# Patient Record
Sex: Female | Born: 1940 | Race: White | Hispanic: No | Smoking: Never smoker
Health system: Southern US, Community
[De-identification: ages and names within clinical notes are randomized; demographics above are authoritative.]

## PROBLEM LIST (undated history)

## (undated) DIAGNOSIS — L719 Rosacea, unspecified: Secondary | ICD-10-CM

## (undated) DIAGNOSIS — K279 Peptic ulcer, site unspecified, unspecified as acute or chronic, without hemorrhage or perforation: Secondary | ICD-10-CM

## (undated) DIAGNOSIS — N390 Urinary tract infection, site not specified: Secondary | ICD-10-CM

## (undated) DIAGNOSIS — E785 Hyperlipidemia, unspecified: Secondary | ICD-10-CM

## (undated) DIAGNOSIS — T7840XA Allergy, unspecified, initial encounter: Secondary | ICD-10-CM

## (undated) DIAGNOSIS — M1990 Unspecified osteoarthritis, unspecified site: Secondary | ICD-10-CM

## (undated) DIAGNOSIS — K219 Gastro-esophageal reflux disease without esophagitis: Secondary | ICD-10-CM

## (undated) DIAGNOSIS — M5430 Sciatica, unspecified side: Secondary | ICD-10-CM

## (undated) DIAGNOSIS — H269 Unspecified cataract: Secondary | ICD-10-CM

## (undated) DIAGNOSIS — J45909 Unspecified asthma, uncomplicated: Secondary | ICD-10-CM

## (undated) DIAGNOSIS — C801 Malignant (primary) neoplasm, unspecified: Secondary | ICD-10-CM

## (undated) HISTORY — DX: Unspecified osteoarthritis, unspecified site: M19.90

## (undated) HISTORY — PX: COLONOSCOPY: SHX174

## (undated) HISTORY — DX: Unspecified asthma, uncomplicated: J45.909

## (undated) HISTORY — DX: Urinary tract infection, site not specified: N39.0

## (undated) HISTORY — DX: Sciatica, unspecified side: M54.30

## (undated) HISTORY — DX: Unspecified cataract: H26.9

## (undated) HISTORY — DX: Hyperlipidemia, unspecified: E78.5

## (undated) HISTORY — DX: Peptic ulcer, site unspecified, unspecified as acute or chronic, without hemorrhage or perforation: K27.9

## (undated) HISTORY — PX: CATARACT EXTRACTION: SUR2

## (undated) HISTORY — DX: Allergy, unspecified, initial encounter: T78.40XA

## (undated) HISTORY — DX: Gastro-esophageal reflux disease without esophagitis: K21.9

## (undated) HISTORY — PX: NASAL SEPTUM SURGERY: SHX37

## (undated) HISTORY — DX: Malignant (primary) neoplasm, unspecified: C80.1

---

## 1958-07-14 HISTORY — PX: PILONIDAL CYST EXCISION: SHX744

## 1958-07-14 HISTORY — PX: TONSILLECTOMY: SUR1361

## 1992-07-14 HISTORY — PX: TOTAL HIP ARTHROPLASTY: SHX124

## 1998-07-14 HISTORY — PX: LASIK: SHX215

## 2004-11-19 ENCOUNTER — Emergency Department (HOSPITAL_COMMUNITY): Admission: EM | Admit: 2004-11-19 | Discharge: 2004-11-19 | Payer: Self-pay | Admitting: Emergency Medicine

## 2006-07-14 HISTORY — PX: COLON RESECTION: SHX5231

## 2011-07-17 DIAGNOSIS — H2510 Age-related nuclear cataract, unspecified eye: Secondary | ICD-10-CM | POA: Diagnosis not present

## 2011-07-21 DIAGNOSIS — M545 Low back pain: Secondary | ICD-10-CM | POA: Diagnosis not present

## 2011-07-21 DIAGNOSIS — M5126 Other intervertebral disc displacement, lumbar region: Secondary | ICD-10-CM | POA: Diagnosis not present

## 2011-07-21 DIAGNOSIS — M48061 Spinal stenosis, lumbar region without neurogenic claudication: Secondary | ICD-10-CM | POA: Diagnosis not present

## 2011-07-28 DIAGNOSIS — H524 Presbyopia: Secondary | ICD-10-CM | POA: Diagnosis not present

## 2011-07-28 DIAGNOSIS — H2510 Age-related nuclear cataract, unspecified eye: Secondary | ICD-10-CM | POA: Diagnosis not present

## 2011-07-28 DIAGNOSIS — Z8719 Personal history of other diseases of the digestive system: Secondary | ICD-10-CM | POA: Diagnosis not present

## 2011-07-28 DIAGNOSIS — Z85038 Personal history of other malignant neoplasm of large intestine: Secondary | ICD-10-CM | POA: Diagnosis not present

## 2011-07-28 DIAGNOSIS — H269 Unspecified cataract: Secondary | ICD-10-CM | POA: Diagnosis not present

## 2011-07-28 DIAGNOSIS — J45909 Unspecified asthma, uncomplicated: Secondary | ICD-10-CM | POA: Diagnosis not present

## 2011-08-07 DIAGNOSIS — M5430 Sciatica, unspecified side: Secondary | ICD-10-CM | POA: Diagnosis not present

## 2011-09-02 DIAGNOSIS — J45909 Unspecified asthma, uncomplicated: Secondary | ICD-10-CM | POA: Diagnosis not present

## 2011-10-16 DIAGNOSIS — L02619 Cutaneous abscess of unspecified foot: Secondary | ICD-10-CM | POA: Diagnosis not present

## 2011-10-16 DIAGNOSIS — L03119 Cellulitis of unspecified part of limb: Secondary | ICD-10-CM | POA: Diagnosis not present

## 2011-10-16 DIAGNOSIS — L989 Disorder of the skin and subcutaneous tissue, unspecified: Secondary | ICD-10-CM | POA: Diagnosis not present

## 2011-11-26 DIAGNOSIS — M5030 Other cervical disc degeneration, unspecified cervical region: Secondary | ICD-10-CM | POA: Diagnosis not present

## 2011-11-26 DIAGNOSIS — M533 Sacrococcygeal disorders, not elsewhere classified: Secondary | ICD-10-CM | POA: Diagnosis not present

## 2011-11-26 DIAGNOSIS — M999 Biomechanical lesion, unspecified: Secondary | ICD-10-CM | POA: Diagnosis not present

## 2011-11-26 DIAGNOSIS — Z96649 Presence of unspecified artificial hip joint: Secondary | ICD-10-CM | POA: Diagnosis not present

## 2011-11-27 DIAGNOSIS — R609 Edema, unspecified: Secondary | ICD-10-CM | POA: Diagnosis not present

## 2011-12-18 DIAGNOSIS — Z1231 Encounter for screening mammogram for malignant neoplasm of breast: Secondary | ICD-10-CM | POA: Diagnosis not present

## 2012-01-01 DIAGNOSIS — L299 Pruritus, unspecified: Secondary | ICD-10-CM | POA: Diagnosis not present

## 2012-01-01 DIAGNOSIS — E785 Hyperlipidemia, unspecified: Secondary | ICD-10-CM | POA: Diagnosis not present

## 2012-01-01 DIAGNOSIS — J45909 Unspecified asthma, uncomplicated: Secondary | ICD-10-CM | POA: Diagnosis not present

## 2012-01-01 DIAGNOSIS — K219 Gastro-esophageal reflux disease without esophagitis: Secondary | ICD-10-CM | POA: Diagnosis not present

## 2012-01-08 DIAGNOSIS — M5030 Other cervical disc degeneration, unspecified cervical region: Secondary | ICD-10-CM | POA: Diagnosis not present

## 2012-01-08 DIAGNOSIS — M533 Sacrococcygeal disorders, not elsewhere classified: Secondary | ICD-10-CM | POA: Diagnosis not present

## 2012-01-08 DIAGNOSIS — M999 Biomechanical lesion, unspecified: Secondary | ICD-10-CM | POA: Diagnosis not present

## 2012-01-08 DIAGNOSIS — Z96649 Presence of unspecified artificial hip joint: Secondary | ICD-10-CM | POA: Diagnosis not present

## 2012-03-03 DIAGNOSIS — H2510 Age-related nuclear cataract, unspecified eye: Secondary | ICD-10-CM | POA: Diagnosis not present

## 2012-03-03 DIAGNOSIS — H35369 Drusen (degenerative) of macula, unspecified eye: Secondary | ICD-10-CM | POA: Diagnosis not present

## 2012-03-03 DIAGNOSIS — H5210 Myopia, unspecified eye: Secondary | ICD-10-CM | POA: Diagnosis not present

## 2012-03-03 DIAGNOSIS — H04129 Dry eye syndrome of unspecified lacrimal gland: Secondary | ICD-10-CM | POA: Diagnosis not present

## 2012-03-08 DIAGNOSIS — Z23 Encounter for immunization: Secondary | ICD-10-CM | POA: Diagnosis not present

## 2012-04-07 DIAGNOSIS — K219 Gastro-esophageal reflux disease without esophagitis: Secondary | ICD-10-CM | POA: Diagnosis not present

## 2012-04-07 DIAGNOSIS — Z85038 Personal history of other malignant neoplasm of large intestine: Secondary | ICD-10-CM | POA: Diagnosis not present

## 2012-04-14 DIAGNOSIS — K219 Gastro-esophageal reflux disease without esophagitis: Secondary | ICD-10-CM | POA: Diagnosis not present

## 2012-04-14 DIAGNOSIS — E785 Hyperlipidemia, unspecified: Secondary | ICD-10-CM | POA: Diagnosis not present

## 2012-05-06 DIAGNOSIS — H43819 Vitreous degeneration, unspecified eye: Secondary | ICD-10-CM | POA: Diagnosis not present

## 2012-05-06 DIAGNOSIS — Z961 Presence of intraocular lens: Secondary | ICD-10-CM | POA: Diagnosis not present

## 2012-05-06 DIAGNOSIS — H35369 Drusen (degenerative) of macula, unspecified eye: Secondary | ICD-10-CM | POA: Diagnosis not present

## 2012-05-06 DIAGNOSIS — H2510 Age-related nuclear cataract, unspecified eye: Secondary | ICD-10-CM | POA: Diagnosis not present

## 2012-05-10 DIAGNOSIS — Z1211 Encounter for screening for malignant neoplasm of colon: Secondary | ICD-10-CM | POA: Diagnosis not present

## 2012-05-10 DIAGNOSIS — B379 Candidiasis, unspecified: Secondary | ICD-10-CM | POA: Diagnosis not present

## 2012-05-10 DIAGNOSIS — K219 Gastro-esophageal reflux disease without esophagitis: Secondary | ICD-10-CM | POA: Diagnosis not present

## 2012-05-10 DIAGNOSIS — K228 Other specified diseases of esophagus: Secondary | ICD-10-CM | POA: Diagnosis not present

## 2012-05-10 DIAGNOSIS — Z85038 Personal history of other malignant neoplasm of large intestine: Secondary | ICD-10-CM | POA: Diagnosis not present

## 2012-05-10 DIAGNOSIS — K5730 Diverticulosis of large intestine without perforation or abscess without bleeding: Secondary | ICD-10-CM | POA: Diagnosis not present

## 2012-05-10 DIAGNOSIS — R12 Heartburn: Secondary | ICD-10-CM | POA: Diagnosis not present

## 2012-05-10 DIAGNOSIS — K5289 Other specified noninfective gastroenteritis and colitis: Secondary | ICD-10-CM | POA: Diagnosis not present

## 2012-05-10 DIAGNOSIS — Z980 Intestinal bypass and anastomosis status: Secondary | ICD-10-CM | POA: Diagnosis not present

## 2012-05-11 DIAGNOSIS — K5289 Other specified noninfective gastroenteritis and colitis: Secondary | ICD-10-CM | POA: Diagnosis not present

## 2012-05-17 DIAGNOSIS — B370 Candidal stomatitis: Secondary | ICD-10-CM | POA: Diagnosis not present

## 2012-05-17 DIAGNOSIS — J029 Acute pharyngitis, unspecified: Secondary | ICD-10-CM | POA: Diagnosis not present

## 2012-05-20 DIAGNOSIS — H353190 Nonexudative age-related macular degeneration, unspecified eye, stage unspecified: Secondary | ICD-10-CM | POA: Diagnosis not present

## 2012-05-20 DIAGNOSIS — H2510 Age-related nuclear cataract, unspecified eye: Secondary | ICD-10-CM | POA: Diagnosis not present

## 2012-05-20 DIAGNOSIS — H43819 Vitreous degeneration, unspecified eye: Secondary | ICD-10-CM | POA: Diagnosis not present

## 2012-05-20 DIAGNOSIS — H35369 Drusen (degenerative) of macula, unspecified eye: Secondary | ICD-10-CM | POA: Diagnosis not present

## 2012-06-07 DIAGNOSIS — Z01818 Encounter for other preprocedural examination: Secondary | ICD-10-CM | POA: Diagnosis not present

## 2012-06-07 DIAGNOSIS — H269 Unspecified cataract: Secondary | ICD-10-CM | POA: Diagnosis not present

## 2012-06-07 DIAGNOSIS — Z01810 Encounter for preprocedural cardiovascular examination: Secondary | ICD-10-CM | POA: Diagnosis not present

## 2012-06-29 DIAGNOSIS — H2510 Age-related nuclear cataract, unspecified eye: Secondary | ICD-10-CM | POA: Diagnosis not present

## 2012-07-05 DIAGNOSIS — K219 Gastro-esophageal reflux disease without esophagitis: Secondary | ICD-10-CM | POA: Diagnosis not present

## 2012-07-05 DIAGNOSIS — E785 Hyperlipidemia, unspecified: Secondary | ICD-10-CM | POA: Diagnosis not present

## 2012-07-05 DIAGNOSIS — J45909 Unspecified asthma, uncomplicated: Secondary | ICD-10-CM | POA: Diagnosis not present

## 2012-07-26 DIAGNOSIS — J45909 Unspecified asthma, uncomplicated: Secondary | ICD-10-CM | POA: Diagnosis not present

## 2012-07-26 DIAGNOSIS — Z8719 Personal history of other diseases of the digestive system: Secondary | ICD-10-CM | POA: Diagnosis not present

## 2012-07-26 DIAGNOSIS — H269 Unspecified cataract: Secondary | ICD-10-CM | POA: Diagnosis not present

## 2012-07-26 DIAGNOSIS — H2510 Age-related nuclear cataract, unspecified eye: Secondary | ICD-10-CM | POA: Diagnosis not present

## 2012-07-26 DIAGNOSIS — H5210 Myopia, unspecified eye: Secondary | ICD-10-CM | POA: Diagnosis not present

## 2012-07-26 DIAGNOSIS — Z85038 Personal history of other malignant neoplasm of large intestine: Secondary | ICD-10-CM | POA: Diagnosis not present

## 2012-09-01 DIAGNOSIS — J45909 Unspecified asthma, uncomplicated: Secondary | ICD-10-CM | POA: Diagnosis not present

## 2012-10-08 DIAGNOSIS — Z87440 Personal history of urinary (tract) infections: Secondary | ICD-10-CM | POA: Diagnosis not present

## 2012-10-08 DIAGNOSIS — IMO0002 Reserved for concepts with insufficient information to code with codable children: Secondary | ICD-10-CM | POA: Diagnosis not present

## 2012-11-08 DIAGNOSIS — H0019 Chalazion unspecified eye, unspecified eyelid: Secondary | ICD-10-CM | POA: Diagnosis not present

## 2012-11-19 DIAGNOSIS — H0019 Chalazion unspecified eye, unspecified eyelid: Secondary | ICD-10-CM | POA: Diagnosis not present

## 2012-11-22 DIAGNOSIS — M79609 Pain in unspecified limb: Secondary | ICD-10-CM | POA: Diagnosis not present

## 2012-11-22 DIAGNOSIS — L851 Acquired keratosis [keratoderma] palmaris et plantaris: Secondary | ICD-10-CM | POA: Diagnosis not present

## 2013-01-03 DIAGNOSIS — R51 Headache: Secondary | ICD-10-CM | POA: Diagnosis not present

## 2013-01-03 DIAGNOSIS — F329 Major depressive disorder, single episode, unspecified: Secondary | ICD-10-CM | POA: Diagnosis not present

## 2013-01-03 DIAGNOSIS — J45909 Unspecified asthma, uncomplicated: Secondary | ICD-10-CM | POA: Diagnosis not present

## 2013-01-19 DIAGNOSIS — H35369 Drusen (degenerative) of macula, unspecified eye: Secondary | ICD-10-CM | POA: Diagnosis not present

## 2013-01-19 DIAGNOSIS — H04129 Dry eye syndrome of unspecified lacrimal gland: Secondary | ICD-10-CM | POA: Diagnosis not present

## 2013-01-19 DIAGNOSIS — Z961 Presence of intraocular lens: Secondary | ICD-10-CM | POA: Diagnosis not present

## 2013-01-25 DIAGNOSIS — M9981 Other biomechanical lesions of cervical region: Secondary | ICD-10-CM | POA: Diagnosis not present

## 2013-01-25 DIAGNOSIS — IMO0002 Reserved for concepts with insufficient information to code with codable children: Secondary | ICD-10-CM | POA: Diagnosis not present

## 2013-01-25 DIAGNOSIS — M5137 Other intervertebral disc degeneration, lumbosacral region: Secondary | ICD-10-CM | POA: Diagnosis not present

## 2013-01-25 DIAGNOSIS — M542 Cervicalgia: Secondary | ICD-10-CM | POA: Diagnosis not present

## 2013-01-27 DIAGNOSIS — Z1231 Encounter for screening mammogram for malignant neoplasm of breast: Secondary | ICD-10-CM | POA: Diagnosis not present

## 2013-01-27 DIAGNOSIS — M949 Disorder of cartilage, unspecified: Secondary | ICD-10-CM | POA: Diagnosis not present

## 2013-01-27 DIAGNOSIS — M899 Disorder of bone, unspecified: Secondary | ICD-10-CM | POA: Diagnosis not present

## 2013-02-02 DIAGNOSIS — M5137 Other intervertebral disc degeneration, lumbosacral region: Secondary | ICD-10-CM | POA: Diagnosis not present

## 2013-02-02 DIAGNOSIS — M9981 Other biomechanical lesions of cervical region: Secondary | ICD-10-CM | POA: Diagnosis not present

## 2013-02-02 DIAGNOSIS — M542 Cervicalgia: Secondary | ICD-10-CM | POA: Diagnosis not present

## 2013-02-02 DIAGNOSIS — IMO0002 Reserved for concepts with insufficient information to code with codable children: Secondary | ICD-10-CM | POA: Diagnosis not present

## 2013-03-07 DIAGNOSIS — Z23 Encounter for immunization: Secondary | ICD-10-CM | POA: Diagnosis not present

## 2013-05-26 DIAGNOSIS — H353190 Nonexudative age-related macular degeneration, unspecified eye, stage unspecified: Secondary | ICD-10-CM | POA: Diagnosis not present

## 2013-05-26 DIAGNOSIS — H43819 Vitreous degeneration, unspecified eye: Secondary | ICD-10-CM | POA: Diagnosis not present

## 2013-05-26 DIAGNOSIS — Z961 Presence of intraocular lens: Secondary | ICD-10-CM | POA: Diagnosis not present

## 2013-06-13 DIAGNOSIS — H353190 Nonexudative age-related macular degeneration, unspecified eye, stage unspecified: Secondary | ICD-10-CM | POA: Diagnosis not present

## 2013-06-13 DIAGNOSIS — H04129 Dry eye syndrome of unspecified lacrimal gland: Secondary | ICD-10-CM | POA: Diagnosis not present

## 2013-06-15 DIAGNOSIS — N949 Unspecified condition associated with female genital organs and menstrual cycle: Secondary | ICD-10-CM | POA: Diagnosis not present

## 2013-06-15 DIAGNOSIS — N390 Urinary tract infection, site not specified: Secondary | ICD-10-CM | POA: Diagnosis not present

## 2013-06-21 DIAGNOSIS — E785 Hyperlipidemia, unspecified: Secondary | ICD-10-CM | POA: Diagnosis not present

## 2013-06-21 DIAGNOSIS — K219 Gastro-esophageal reflux disease without esophagitis: Secondary | ICD-10-CM | POA: Diagnosis not present

## 2013-06-21 DIAGNOSIS — N949 Unspecified condition associated with female genital organs and menstrual cycle: Secondary | ICD-10-CM | POA: Diagnosis not present

## 2013-08-03 DIAGNOSIS — N949 Unspecified condition associated with female genital organs and menstrual cycle: Secondary | ICD-10-CM | POA: Diagnosis not present

## 2013-08-03 DIAGNOSIS — N814 Uterovaginal prolapse, unspecified: Secondary | ICD-10-CM | POA: Diagnosis not present

## 2013-10-06 DIAGNOSIS — J45909 Unspecified asthma, uncomplicated: Secondary | ICD-10-CM | POA: Diagnosis not present

## 2013-10-11 DIAGNOSIS — E785 Hyperlipidemia, unspecified: Secondary | ICD-10-CM | POA: Diagnosis not present

## 2013-10-19 DIAGNOSIS — M5137 Other intervertebral disc degeneration, lumbosacral region: Secondary | ICD-10-CM | POA: Diagnosis not present

## 2013-10-19 DIAGNOSIS — IMO0002 Reserved for concepts with insufficient information to code with codable children: Secondary | ICD-10-CM | POA: Diagnosis not present

## 2013-10-19 DIAGNOSIS — M542 Cervicalgia: Secondary | ICD-10-CM | POA: Diagnosis not present

## 2013-10-19 DIAGNOSIS — M9981 Other biomechanical lesions of cervical region: Secondary | ICD-10-CM | POA: Diagnosis not present

## 2013-10-21 DIAGNOSIS — M542 Cervicalgia: Secondary | ICD-10-CM | POA: Diagnosis not present

## 2013-10-21 DIAGNOSIS — IMO0002 Reserved for concepts with insufficient information to code with codable children: Secondary | ICD-10-CM | POA: Diagnosis not present

## 2013-10-21 DIAGNOSIS — M5137 Other intervertebral disc degeneration, lumbosacral region: Secondary | ICD-10-CM | POA: Diagnosis not present

## 2013-10-21 DIAGNOSIS — M9981 Other biomechanical lesions of cervical region: Secondary | ICD-10-CM | POA: Diagnosis not present

## 2013-10-25 DIAGNOSIS — M5137 Other intervertebral disc degeneration, lumbosacral region: Secondary | ICD-10-CM | POA: Diagnosis not present

## 2013-10-25 DIAGNOSIS — IMO0002 Reserved for concepts with insufficient information to code with codable children: Secondary | ICD-10-CM | POA: Diagnosis not present

## 2013-10-25 DIAGNOSIS — M542 Cervicalgia: Secondary | ICD-10-CM | POA: Diagnosis not present

## 2013-10-25 DIAGNOSIS — M9981 Other biomechanical lesions of cervical region: Secondary | ICD-10-CM | POA: Diagnosis not present

## 2013-10-31 DIAGNOSIS — M542 Cervicalgia: Secondary | ICD-10-CM | POA: Diagnosis not present

## 2013-10-31 DIAGNOSIS — IMO0002 Reserved for concepts with insufficient information to code with codable children: Secondary | ICD-10-CM | POA: Diagnosis not present

## 2013-10-31 DIAGNOSIS — M5137 Other intervertebral disc degeneration, lumbosacral region: Secondary | ICD-10-CM | POA: Diagnosis not present

## 2013-10-31 DIAGNOSIS — M9981 Other biomechanical lesions of cervical region: Secondary | ICD-10-CM | POA: Diagnosis not present

## 2013-11-28 DIAGNOSIS — S139XXA Sprain of joints and ligaments of unspecified parts of neck, initial encounter: Secondary | ICD-10-CM | POA: Diagnosis not present

## 2013-11-28 DIAGNOSIS — M999 Biomechanical lesion, unspecified: Secondary | ICD-10-CM | POA: Diagnosis not present

## 2013-11-28 DIAGNOSIS — M9981 Other biomechanical lesions of cervical region: Secondary | ICD-10-CM | POA: Diagnosis not present

## 2013-12-12 DIAGNOSIS — Z961 Presence of intraocular lens: Secondary | ICD-10-CM | POA: Diagnosis not present

## 2013-12-12 DIAGNOSIS — H353190 Nonexudative age-related macular degeneration, unspecified eye, stage unspecified: Secondary | ICD-10-CM | POA: Diagnosis not present

## 2013-12-12 DIAGNOSIS — H04129 Dry eye syndrome of unspecified lacrimal gland: Secondary | ICD-10-CM | POA: Diagnosis not present

## 2013-12-21 DIAGNOSIS — K219 Gastro-esophageal reflux disease without esophagitis: Secondary | ICD-10-CM | POA: Diagnosis not present

## 2013-12-21 DIAGNOSIS — M5430 Sciatica, unspecified side: Secondary | ICD-10-CM | POA: Diagnosis not present

## 2014-01-10 DIAGNOSIS — M9981 Other biomechanical lesions of cervical region: Secondary | ICD-10-CM | POA: Diagnosis not present

## 2014-01-10 DIAGNOSIS — M5126 Other intervertebral disc displacement, lumbar region: Secondary | ICD-10-CM | POA: Diagnosis not present

## 2014-01-10 DIAGNOSIS — M999 Biomechanical lesion, unspecified: Secondary | ICD-10-CM | POA: Diagnosis not present

## 2014-01-10 DIAGNOSIS — M5430 Sciatica, unspecified side: Secondary | ICD-10-CM | POA: Diagnosis not present

## 2014-05-29 DIAGNOSIS — Z23 Encounter for immunization: Secondary | ICD-10-CM | POA: Diagnosis not present

## 2014-07-25 ENCOUNTER — Encounter: Payer: Self-pay | Admitting: Internal Medicine

## 2014-07-25 DIAGNOSIS — K219 Gastro-esophageal reflux disease without esophagitis: Secondary | ICD-10-CM | POA: Diagnosis not present

## 2014-07-25 DIAGNOSIS — N390 Urinary tract infection, site not specified: Secondary | ICD-10-CM | POA: Diagnosis not present

## 2014-07-25 DIAGNOSIS — J45909 Unspecified asthma, uncomplicated: Secondary | ICD-10-CM | POA: Diagnosis not present

## 2014-07-25 DIAGNOSIS — K279 Peptic ulcer, site unspecified, unspecified as acute or chronic, without hemorrhage or perforation: Secondary | ICD-10-CM | POA: Diagnosis not present

## 2014-08-07 DIAGNOSIS — J0181 Other acute recurrent sinusitis: Secondary | ICD-10-CM | POA: Diagnosis not present

## 2014-08-11 ENCOUNTER — Encounter: Payer: Self-pay | Admitting: Internal Medicine

## 2014-08-11 ENCOUNTER — Ambulatory Visit (INDEPENDENT_AMBULATORY_CARE_PROVIDER_SITE_OTHER): Payer: Medicare Other | Admitting: Internal Medicine

## 2014-08-11 VITALS — BP 154/94 | HR 86 | Ht 62.0 in | Wt 148.0 lb

## 2014-08-11 DIAGNOSIS — R198 Other specified symptoms and signs involving the digestive system and abdomen: Secondary | ICD-10-CM | POA: Diagnosis not present

## 2014-08-11 DIAGNOSIS — R0989 Other specified symptoms and signs involving the circulatory and respiratory systems: Secondary | ICD-10-CM | POA: Insufficient documentation

## 2014-08-11 DIAGNOSIS — Z8709 Personal history of other diseases of the respiratory system: Secondary | ICD-10-CM | POA: Diagnosis not present

## 2014-08-11 DIAGNOSIS — Z23 Encounter for immunization: Secondary | ICD-10-CM | POA: Diagnosis not present

## 2014-08-11 DIAGNOSIS — R6889 Other general symptoms and signs: Secondary | ICD-10-CM

## 2014-08-11 DIAGNOSIS — J4530 Mild persistent asthma, uncomplicated: Secondary | ICD-10-CM | POA: Insufficient documentation

## 2014-08-11 MED ORDER — FLUTICASONE PROPIONATE 50 MCG/ACT NA SUSP: 2.0000 | Freq: Every day | NASAL | Status: DC

## 2014-08-11 NOTE — Addendum Note (Signed)
Addended by: Maurice March on: 08/11/2014 10:15 AM   Modules accepted: Orders

## 2014-08-11 NOTE — Addendum Note (Signed)
Addended by: Maurice March on: 08/11/2014 05:16 PM   Modules accepted: Orders

## 2014-08-11 NOTE — Progress Notes (Signed)
Subjective:    Patient ID: Jasmine Velez, female    DOB: [birth year], 74 y.o.   MRN: [id number] PCP Jani Gravel, MD   HPI  IOV 08/11/2014  Chief Complaint  Patient presents with  . Pulmonary Consult    Pt referred by Dr. Maudie Mercury for asthma.    74 year old female referred by Dr. Maudie Mercury for asthma. Due to history of asthma for 30 years. She is a new [employer] resident. Asthma symptoms are made worse by not taking her inhalers and also by stress. She used to be followed by a Dr. Gerarda Gunther in [facility]. Asthma is associated with multiple medication allergies but no allergy to aspirin. She reports a diagnosis of asthma made 30 years ago. Since then has been on several inhalers but for the last few to several years has been on Pulmicort Turbuhaler twice daily. Symptoms only mild intermittent with this regimen he did she uses albuterol roughly every 6 weeks. In times of stress of bad weather she could use it a little bit more but never more than 2 times a week. No nocturnal symptoms. Last prednisone use for an exacerbation was several years ago. Currently she is here due to establishing care with a pulmonologist and reports stable health. Review of vaccine history so shows that she likely had Pneumovax many years ago in [place]. She's not had her Prevnar yet. She is up-to-date with her flu shot for this season.  Of note, she has chronic postnasal drainage that is mild and associated chronic clearing of the throat/she also gives a history of stage I colon cancer many years ago status post resection and no chemotherapy  I do not see any x-ray in our system are done by Dr. Maudie Mercury and review of his notes  Spirometry today is normal        has a past medical history of Asthma; GERD (gastroesophageal reflux disease); Sciatica; UTI (lower urinary tract infection); and PUD (peptic ulcer disease).   reports that she has never smoked. She has never used smokeless tobacco.  Past Surgical  History  Procedure Laterality Date  . Colon resection  2008  . Total hip arthroplasty  1994  . Nasal septum surgery  1980's  . Pilonidal cyst excision  1960  . Tonsillectomy  1960  . Cataract extraction  2013, 2014  . Lasik  2000    Allergies  Allergen Reactions  . Ceclor [Cefaclor] Nausea Only    Gerd  . Nexium [Esomeprazole Magnesium] Nausea Only    sick  . Erythromycin Rash    In mouth  . Penicillins Rash    In her moth    Immunization History  Administered Date(s) Administered  . Influenza Split 04/13/2014    Family History  Problem Relation Age of Onset  . Dementia Mother   . Dementia Sister   . CAD Father      Current outpatient prescriptions:  .  albuterol (PROVENTIL HFA;VENTOLIN HFA) 108 (90 BASE) MCG/ACT inhaler, Inhale 2 puffs into the lungs every 6 (six) hours as needed for wheezing or shortness of breath., Disp: , Rfl:  .  budesonide (PULMICORT) 180 MCG/ACT inhaler, Inhale 2 puffs into the lungs 2 (two) times daily., Disp: , Rfl:  .  hydrochlorothiazide (HYDRODIURIL) 25 MG tablet, Take 25 mg by mouth daily as needed., Disp: , Rfl:  .  mometasone (NASONEX) 50 MCG/ACT nasal spray, Place 2 sprays into the nose daily., Disp: , Rfl:  .  Multiple Vitamin (MULTIVITAMIN) capsule, Take 1  capsule by mouth daily., Disp: , Rfl:  .  multivitamin-lutein (OCUVITE-LUTEIN) CAPS capsule, Take 1 capsule by mouth daily., Disp: , Rfl:  .  RABEprazole (ACIPHEX) 20 MG tablet, Take 20 mg by mouth daily., Disp: , Rfl:     Review of Systems  Constitutional: Negative for fever and unexpected weight change.  HENT: Positive for congestion, postnasal drip and sinus pressure. Negative for dental problem, ear pain, nosebleeds, rhinorrhea, sneezing, sore throat and trouble swallowing.   Eyes: Negative for redness and itching.  Respiratory: Negative for cough, chest tightness, shortness of breath and wheezing.   Cardiovascular: Negative for palpitations and leg swelling.    Gastrointestinal: Negative for nausea and vomiting.  Genitourinary: Negative for dysuria.  Musculoskeletal: Negative for joint swelling.  Skin: Negative for rash.  Neurological: Negative for headaches.  Hematological: Does not bruise/bleed easily.  Psychiatric/Behavioral: Negative for dysphoric mood. The patient is not nervous/anxious.        Objective:   Physical Exam  Constitutional: She is oriented to person, place, and time. She appears well-developed and well-nourished. No distress.  HENT:  Head: Normocephalic and atraumatic.  Right Ear: External ear normal.  Left Ear: External ear normal.  Mouth/Throat: Oropharynx is clear and moist. No oropharyngeal exudate.  Postnasal drainage present and is mild. She has constant clearing of the throat every few minutes  Eyes: Conjunctivae and EOM are normal. Pupils are equal, round, and reactive to light. Right eye exhibits no discharge. Left eye exhibits no discharge. No scleral icterus.  Neck: Normal range of motion. Neck supple. No JVD present. No tracheal deviation present. No thyromegaly present.  Cardiovascular: Normal rate, regular rhythm, normal heart sounds and intact distal pulses.  Exam reveals no gallop and no friction rub.   No murmur heard. Pulmonary/Chest: Effort normal and breath sounds normal. No respiratory distress. She has no wheezes. She has no rales. She exhibits no tenderness.  Abdominal: Soft. Bowel sounds are normal. She exhibits no distension and no mass. There is no tenderness. There is no rebound and no guarding.  Musculoskeletal: Normal range of motion. She exhibits no edema or tenderness.  Lymphadenopathy:    She has no cervical adenopathy.  Neurological: She is alert and oriented to person, place, and time. She has normal reflexes. No cranial nerve deficit. She exhibits normal muscle tone. Coordination normal.  Skin: Skin is warm and dry. No rash noted. She is not diaphoretic. No erythema. No pallor.   Psychiatric: She has a normal mood and affect. Her behavior is normal. Judgment and thought content normal.  Vitals reviewed.   Filed Vitals:   08/11/14 0906  BP: 154/94  Pulse: 86  Height: 5\' 2"  (1.575 m)  Weight: 148 lb (67.132 kg)  SpO2: 97%         Assessment & Plan:     ICD-9-CM ICD-10-CM   1. Mild persistent asthma, uncomplicated 563.87 F64.33   2. History of asthma V12.69 Z87.09 Spirometry with Graph  3. Chronic throat clearing 784.99 R19.8     ASthma is stable Continue pulmicort twice daily - take some samples Use albuterol as needed Prevnar vaccine ,08/11/2014   STart  generic fluticasone inhaler 2 squirts each nostril daily AVoid throat clearing by drinking water, swallowing saliva  Followup  6 months or sooner if needed Consider repeat spirometry and cxr at followup    Dr. Brand Males, M.D., [facility].C.P Pulmonary and Critical Care Medicine Staff Physician [facility] Pager: [phone], If no  answer or between  15:00h - 7:00h: call [phone]  08/11/2014 9:45 AM

## 2014-08-11 NOTE — Patient Instructions (Addendum)
ICD-9-CM ICD-10-CM   1. Mild persistent asthma, uncomplicated 536.14 E31.54   2. History of asthma V12.69 Z87.09 Spirometry with Graph  3. Chronic throat clearing 784.99 R19.8     ASthma is stable Continue pulmicort twice daily - take some samples Use albuterol as needed Prevnar vaccine ,08/11/2014   STart  generic fluticasone inhaler 2 squirts each nostril daily AVoid throat clearing by drinking water, swallowing saliva  Followup  6 months or sooner if needed Consider repeat spirometry and cxr at followup

## 2014-08-17 DIAGNOSIS — E785 Hyperlipidemia, unspecified: Secondary | ICD-10-CM | POA: Diagnosis not present

## 2014-08-17 DIAGNOSIS — R5383 Other fatigue: Secondary | ICD-10-CM | POA: Diagnosis not present

## 2014-08-17 DIAGNOSIS — N390 Urinary tract infection, site not specified: Secondary | ICD-10-CM | POA: Diagnosis not present

## 2014-08-17 DIAGNOSIS — Z0000 Encounter for general adult medical examination without abnormal findings: Secondary | ICD-10-CM | POA: Diagnosis not present

## 2014-08-22 DIAGNOSIS — N390 Urinary tract infection, site not specified: Secondary | ICD-10-CM | POA: Diagnosis not present

## 2014-08-22 DIAGNOSIS — R5383 Other fatigue: Secondary | ICD-10-CM | POA: Diagnosis not present

## 2014-08-22 DIAGNOSIS — K219 Gastro-esophageal reflux disease without esophagitis: Secondary | ICD-10-CM | POA: Diagnosis not present

## 2014-08-22 DIAGNOSIS — J45909 Unspecified asthma, uncomplicated: Secondary | ICD-10-CM | POA: Diagnosis not present

## 2014-09-08 DIAGNOSIS — H3531 Nonexudative age-related macular degeneration: Secondary | ICD-10-CM | POA: Diagnosis not present

## 2014-09-08 DIAGNOSIS — Z961 Presence of intraocular lens: Secondary | ICD-10-CM | POA: Diagnosis not present

## 2014-09-08 DIAGNOSIS — H26492 Other secondary cataract, left eye: Secondary | ICD-10-CM | POA: Diagnosis not present

## 2014-09-08 DIAGNOSIS — H43391 Other vitreous opacities, right eye: Secondary | ICD-10-CM | POA: Diagnosis not present

## 2014-09-08 DIAGNOSIS — H04123 Dry eye syndrome of bilateral lacrimal glands: Secondary | ICD-10-CM | POA: Diagnosis not present

## 2014-10-24 DIAGNOSIS — N390 Urinary tract infection, site not specified: Secondary | ICD-10-CM | POA: Diagnosis not present

## 2014-10-24 DIAGNOSIS — E039 Hypothyroidism, unspecified: Secondary | ICD-10-CM | POA: Diagnosis not present

## 2014-10-24 DIAGNOSIS — E785 Hyperlipidemia, unspecified: Secondary | ICD-10-CM | POA: Diagnosis not present

## 2014-10-27 DIAGNOSIS — E785 Hyperlipidemia, unspecified: Secondary | ICD-10-CM | POA: Diagnosis not present

## 2014-10-27 DIAGNOSIS — I10 Essential (primary) hypertension: Secondary | ICD-10-CM | POA: Diagnosis not present

## 2014-10-27 DIAGNOSIS — I7300 Raynaud's syndrome without gangrene: Secondary | ICD-10-CM | POA: Diagnosis not present

## 2014-10-27 DIAGNOSIS — E039 Hypothyroidism, unspecified: Secondary | ICD-10-CM | POA: Diagnosis not present

## 2014-12-06 DIAGNOSIS — M25551 Pain in right hip: Secondary | ICD-10-CM | POA: Diagnosis not present

## 2014-12-06 DIAGNOSIS — I7300 Raynaud's syndrome without gangrene: Secondary | ICD-10-CM | POA: Diagnosis not present

## 2014-12-06 DIAGNOSIS — M150 Primary generalized (osteo)arthritis: Secondary | ICD-10-CM | POA: Diagnosis not present

## 2015-01-23 DIAGNOSIS — E785 Hyperlipidemia, unspecified: Secondary | ICD-10-CM | POA: Diagnosis not present

## 2015-01-23 DIAGNOSIS — E039 Hypothyroidism, unspecified: Secondary | ICD-10-CM | POA: Diagnosis not present

## 2015-01-26 DIAGNOSIS — K219 Gastro-esophageal reflux disease without esophagitis: Secondary | ICD-10-CM | POA: Diagnosis not present

## 2015-01-26 DIAGNOSIS — E785 Hyperlipidemia, unspecified: Secondary | ICD-10-CM | POA: Diagnosis not present

## 2015-02-07 DIAGNOSIS — R21 Rash and other nonspecific skin eruption: Secondary | ICD-10-CM | POA: Diagnosis not present

## 2015-02-08 ENCOUNTER — Encounter: Payer: Self-pay | Admitting: Internal Medicine

## 2015-02-08 ENCOUNTER — Ambulatory Visit (INDEPENDENT_AMBULATORY_CARE_PROVIDER_SITE_OTHER): Payer: Medicare Other | Admitting: Internal Medicine

## 2015-02-08 VITALS — BP 136/80 | HR 72 | Ht 62.0 in | Wt 147.6 lb

## 2015-02-08 DIAGNOSIS — J4530 Mild persistent asthma, uncomplicated: Secondary | ICD-10-CM | POA: Diagnosis not present

## 2015-02-08 NOTE — Patient Instructions (Addendum)
ICD-9-CM ICD-10-CM   1. Mild persistent asthma, uncomplicated 599.77 S14.23     ASthma is stable and well controlled Continue pulmicort twice daily - take some samples Use albuterol as needed FLu shot in fall 2016  IN late feb 2016:   generic fluticasone inhaler 2 squirts each nostril daily - or use as needed AVoid throat clearing by drinking water, swallowing saliva  Followup 9 months or sooner if needed   - ACQ at followup

## 2015-02-08 NOTE — Progress Notes (Signed)
Subjective:    Patient ID: Jasmine Velez, female    DOB: January 06, 1941, 74 y.o.   MRN: [id number]  HPI   IOV 08/11/2014  Chief Complaint  Patient presents with  . Pulmonary Consult    Pt referred by Dr. Maudie Mercury for asthma.    74 year old female referred by Dr. Maudie Mercury for asthma. Due to history of asthma for 30 years. She is a new [employer] resident. Asthma symptoms are made worse by not taking her inhalers and also by stress. She used to be followed by a Dr. Gerarda Gunther in [facility]. Asthma is associated with multiple medication allergies but no allergy to aspirin. She reports a diagnosis of asthma made 30 years ago. Since then has been on several inhalers but for the last few to several years has been on Pulmicort Turbuhaler twice daily. Symptoms only mild intermittent with this regimen he did she uses albuterol roughly every 6 weeks. In times of stress of bad weather she could use it a little bit more but never more than 2 times a week. No nocturnal symptoms. Last prednisone use for an exacerbation was several years ago. Currently she is here due to establishing care with a pulmonologist and reports stable health. Review of vaccine history so shows that she likely had Pneumovax many years ago in [place]. She's not had her Prevnar yet. She is up-to-date with her flu shot for this season.  Of note, she has chronic postnasal drainage that is mild and associated chronic clearing of the throat/she also gives a history of stage I colon cancer many years ago status post resection and no chemotherapy  I do not see any x-ray in our system are done by Dr. Maudie Mercury and review of his notes  Spirometry today is normal     OV 02/08/2015  Chief Complaint  Patient presents with  . Follow-up    Pt states her breathing is doing well. Pt denies SOB, cough, CP/tightness. Pt denies complaints.     74 year old female mild persistent asthma who presents for 6 month follow-up. She continues to do  well. Rarely uses albuterol. The heat and humidity of [place] does bother her and therefore she occasionally she uses albuterol. Occasionally she uses Flonase as well. Other than that she is fine. Asthma control questionnaire shows excellent control of asthma. She never wakes up in the middle of the night because of asthma and when she wakes up in the morning there are no symptoms. With the humidity it is very slight limitation from asthma but there is no dyspnea and she has not wheezed all used albuterol in the last 1 week. Exhaled nitric oxide today 17 ppb and shows good control. She is worried about the spring and [place] and the pollen bothering her   Off note in [place] she use to visit her pulmonologist once a year but since she is new here she will see me again in 9 months    Asthma Control Panel 02/08/2015   Current Med Regimen pulmicor  ACQ 5 point- 1 week. wtd avg score. <1.0 is good control 0.75-1.25 is grey zone. >1.25 poor control. Delta 0.5 is clinically meaningful 0.2  ACQ 7 point - 1 week. wtd avg score. <1.0 is good control 0.75-1.25 is grey zone. >1.25 poor control. Delta 0.5 is clinically meaningful x  ACT - a [employer] test - 4 week. Total score. Max is 25, Lower score is worse.  <19 = poor control x  FeNO ppB 17  FeV1  x  Planned intervention  for visit Continue pulmicort     Immunization History  Administered Date(s) Administered  . Influenza Split 04/13/2014  . Pneumococcal Conjugate-13 08/11/2014       Current outpatient prescriptions:  .  albuterol (PROVENTIL HFA;VENTOLIN HFA) 108 (90 BASE) MCG/ACT inhaler, Inhale 2 puffs into the lungs every 6 (six) hours as needed for wheezing or shortness of breath., Disp: , Rfl:  .  budesonide (PULMICORT) 180 MCG/ACT inhaler, Inhale 1 puff into the lungs 2 (two) times daily. , Disp: , Rfl:  .  Coenzyme Q10 (CO Q 10 PO), Take 1 tablet by mouth daily., Disp: , Rfl:  .  fluticasone (FLONASE) 50 MCG/ACT nasal  spray, Place 2 sprays into both nostrils daily. (Patient taking differently: Place 2 sprays into both nostrils daily as needed. ), Disp: 16 g, Rfl: 2 .  hydrochlorothiazide (HYDRODIURIL) 25 MG tablet, Take 25 mg by mouth daily as needed., Disp: , Rfl:  .  Multiple Vitamin (MULTIVITAMIN) capsule, Take 1 capsule by mouth daily., Disp: , Rfl:  .  multivitamin-lutein (OCUVITE-LUTEIN) CAPS capsule, Take 1 capsule by mouth daily., Disp: , Rfl:  .  RABEprazole (ACIPHEX) 20 MG tablet, Take 20 mg by mouth daily., Disp: , Rfl:      Review of Systems  Constitutional: Negative for fever and unexpected weight change.  HENT: Negative for congestion, dental problem, ear pain, nosebleeds, postnasal drip, rhinorrhea, sinus pressure, sneezing, sore throat and trouble swallowing.   Eyes: Negative for redness and itching.  Respiratory: Negative for cough, chest tightness, shortness of breath and wheezing.   Cardiovascular: Negative for palpitations and leg swelling.  Gastrointestinal: Negative for nausea and vomiting.  Genitourinary: Negative for dysuria.  Musculoskeletal: Negative for joint swelling.  Skin: Negative for rash.  Neurological: Negative for headaches.  Hematological: Does not bruise/bleed easily.  Psychiatric/Behavioral: Negative for dysphoric mood. The patient is not nervous/anxious.        Objective:   Physical Exam  Constitutional: She is oriented to person, place, and time. She appears well-developed and well-nourished. No distress.  HENT:  Head: Normocephalic and atraumatic.  Right Ear: External ear normal.  Left Ear: External ear normal.  Mouth/Throat: Oropharynx is clear and moist. No oropharyngeal exudate.  Eyes: Conjunctivae and EOM are normal. Pupils are equal, round, and reactive to light. Right eye exhibits no discharge. Left eye exhibits no discharge. No scleral icterus.  Neck: Normal range of motion. Neck supple. No JVD present. No tracheal deviation present. No thyromegaly  present.  Cardiovascular: Normal rate, regular rhythm, normal heart sounds and intact distal pulses.  Exam reveals no gallop and no friction rub.   No murmur heard. Pulmonary/Chest: Effort normal and breath sounds normal. No respiratory distress. She has no wheezes. She has no rales. She exhibits no tenderness.  Abdominal: Soft. Bowel sounds are normal. She exhibits no distension and no mass. There is no tenderness. There is no rebound and no guarding.  Musculoskeletal: Normal range of motion. She exhibits no edema or tenderness.  Lymphadenopathy:    She has no cervical adenopathy.  Neurological: She is alert and oriented to person, place, and time. She has normal reflexes. No cranial nerve deficit. She exhibits normal muscle tone. Coordination normal.  Skin: Skin is warm and dry. No rash noted. She is not diaphoretic. No erythema. No pallor.  Psychiatric: She has a normal mood and affect. Her behavior is normal. Judgment and thought content normal.  Vitals reviewed.   Filed Vitals:  02/08/15 1053  BP: 136/80  Pulse: 72  Height: 5\' 2"  (1.575 m)  Weight: 147 lb 9.6 oz (66.951 kg)  SpO2: 98%          Assessment & Plan:     ICD-9-CM ICD-10-CM   1. Mild persistent asthma, uncomplicated 628.31 D17.61     ASthma is stable and well controlled Continue pulmicort twice daily - take some samples Use albuterol as needed FLu shot in fall 2016  IN late feb 2016:   generic fluticasone inhaler 2 squirts each nostril daily - or use as needed AVoid throat clearing by drinking water, swallowing saliva  Followup 9 months or sooner if needed   - ACQ at followup    Dr. Brand Males, M.D., [facility].C.P Pulmonary and Critical Care Medicine Staff Physician [facility] Pager: [phone], If no answer or between  15:00h - 7:00h: call [phone]  02/08/2015 11:28 AM

## 2015-02-27 DIAGNOSIS — H16213 Exposure keratoconjunctivitis, bilateral: Secondary | ICD-10-CM | POA: Diagnosis not present

## 2015-02-27 DIAGNOSIS — H04123 Dry eye syndrome of bilateral lacrimal glands: Secondary | ICD-10-CM | POA: Diagnosis not present

## 2015-03-20 DIAGNOSIS — M79604 Pain in right leg: Secondary | ICD-10-CM | POA: Diagnosis not present

## 2015-05-29 DIAGNOSIS — E785 Hyperlipidemia, unspecified: Secondary | ICD-10-CM | POA: Diagnosis not present

## 2015-06-01 ENCOUNTER — Other Ambulatory Visit: Payer: Self-pay | Admitting: Internal Medicine

## 2015-06-01 DIAGNOSIS — J45909 Unspecified asthma, uncomplicated: Secondary | ICD-10-CM | POA: Diagnosis not present

## 2015-06-01 DIAGNOSIS — K219 Gastro-esophageal reflux disease without esophagitis: Secondary | ICD-10-CM | POA: Diagnosis not present

## 2015-06-01 DIAGNOSIS — E785 Hyperlipidemia, unspecified: Secondary | ICD-10-CM | POA: Diagnosis not present

## 2015-06-01 DIAGNOSIS — Z0000 Encounter for general adult medical examination without abnormal findings: Secondary | ICD-10-CM | POA: Diagnosis not present

## 2015-09-12 ENCOUNTER — Encounter: Payer: Self-pay | Admitting: Physician Assistant

## 2015-09-12 DIAGNOSIS — K644 Residual hemorrhoidal skin tags: Secondary | ICD-10-CM | POA: Diagnosis not present

## 2015-09-12 DIAGNOSIS — K648 Other hemorrhoids: Secondary | ICD-10-CM | POA: Diagnosis not present

## 2015-09-26 ENCOUNTER — Ambulatory Visit: Payer: Medicare Other | Admitting: Physician Assistant

## 2015-09-26 DIAGNOSIS — Z961 Presence of intraocular lens: Secondary | ICD-10-CM | POA: Diagnosis not present

## 2015-09-26 DIAGNOSIS — H04123 Dry eye syndrome of bilateral lacrimal glands: Secondary | ICD-10-CM | POA: Diagnosis not present

## 2015-09-26 DIAGNOSIS — H26492 Other secondary cataract, left eye: Secondary | ICD-10-CM | POA: Diagnosis not present

## 2015-09-26 DIAGNOSIS — H353131 Nonexudative age-related macular degeneration, bilateral, early dry stage: Secondary | ICD-10-CM | POA: Diagnosis not present

## 2015-11-01 ENCOUNTER — Ambulatory Visit: Payer: Medicare Other | Admitting: Internal Medicine

## 2015-11-06 ENCOUNTER — Encounter: Payer: Self-pay | Admitting: Gastroenterology

## 2015-11-06 ENCOUNTER — Ambulatory Visit (INDEPENDENT_AMBULATORY_CARE_PROVIDER_SITE_OTHER): Payer: Medicare Other | Admitting: Gastroenterology

## 2015-11-06 VITALS — BP 128/78 | HR 72 | Ht 62.0 in | Wt 151.0 lb

## 2015-11-06 DIAGNOSIS — Z85038 Personal history of other malignant neoplasm of large intestine: Secondary | ICD-10-CM

## 2015-11-06 DIAGNOSIS — K625 Hemorrhage of anus and rectum: Secondary | ICD-10-CM

## 2015-11-06 NOTE — Patient Instructions (Signed)
We will put you in our recall database for a colonoscopy about October 2018. Call us sooner if any problems.

## 2015-11-06 NOTE — Progress Notes (Signed)
[facility] Consult Note:  History: Jasmine Velez 11/06/2015  Referring physician: Jani Gravel, MD  Reason for consult/chief complaint: History of colon cancer; Establish Care; and Hemorrhoids   Subjective HPI:  Jasmine Velez sees me as a new patient today to establish care. 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 She underwent a right hemicolectomy, a repeat colonoscopy in 2009 had no polyps, and no polyps on an exam in 2013. She recently had a brief episode of painless rectal bleeding but no change in bowel habits and no dyschezia. She says a similar bleeding has happened a few times over the last 3 years. She denies abdominal pain, appetite has been good and weight stable.  ROS:  Review of Systems She denies chest pain dyspnea or dysuria  Past Medical History: Past Medical History  Diagnosis Date  . Asthma   . GERD (gastroesophageal reflux disease)   . Sciatica   . UTI (lower urinary tract infection)   . PUD (peptic ulcer disease)      Past Surgical History: Past Surgical History  Procedure Laterality Date  . Colon resection  2008  . Total hip arthroplasty  1994  . Nasal septum surgery  1980's  . Pilonidal cyst excision  1960  . Tonsillectomy  1960  . Cataract extraction  2013, 2014  . Lasik  2000     Family History: Family History  Problem Relation Age of Onset  . Dementia Mother   . Dementia Sister   . CAD Father     Social History: Social History   Social History  . Marital Status: Married    Spouse Name: N/A  . Number of Children: N/A  . Years of Education: N/A   Occupational History  . retired    Social History Main Topics  . Smoking status: Never Smoker   . Smokeless tobacco: Never Used  . Alcohol Use: 0.0 oz/week    0 Standard drinks or equivalent per week     Comment: occassional  . Drug Use: No  . Sexual Activity: Not Asked   Other Topics Concern  . None    Social History Narrative    Allergies: Allergies  Allergen Reactions  . Ceclor [Cefaclor] Nausea Only    Gerd  . Nexium [Esomeprazole Magnesium] Nausea Only    sick  . Tramadol   . Erythromycin Rash    In mouth  . Penicillins Rash    In her moth    Outpatient Meds: Current Outpatient Prescriptions  Medication Sig Dispense Refill  . albuterol (PROVENTIL) (2.5 MG/3ML) 0.083% nebulizer solution Take 2.5 mg by nebulization every 6 (six) hours as needed for wheezing or shortness of breath.    . budesonide (PULMICORT) 180 MCG/ACT inhaler Inhale 1 puff into the lungs 2 (two) times daily.     . fluticasone (FLONASE) 50 MCG/ACT nasal spray USE 2 SPRAYS IN EACH NOSTRIL EVERY DAY 16 g 2  . hydrochlorothiazide (HYDRODIURIL) 25 MG tablet Take 25 mg by mouth daily as needed.    . multivitamin-lutein (OCUVITE-LUTEIN) CAPS capsule Take 1 capsule by mouth daily.    . RABEprazole (ACIPHEX) 20 MG tablet Take 20 mg by mouth daily.     No current facility-administered medications for this visit.      ___________________________________________________________________ Objective  Exam:  BP 128/78 mmHg  Pulse 72  Ht 5\' 2"  (1.575 m)  Wt 151 lb (68.493 kg)  BMI 27.61 kg/m2  Eyes: sclera anicteric, no redness  ENT: oral mucosa moist without lesions, no cervical or supraclavicular lymphadenopathy, good dentition  CV: RRR without murmur, S1/S2, no JVD, no peripheral edema  Resp: clear to auscultation bilaterally, normal RR and effort noted  GI: soft, No tenderness, with active bowel sounds. No guarding or palpable organomegaly noted.  Skin; warm and dry, no rash or jaundice noted  Neuro: awake, alert and oriented x 3. Normal gross motor function and fluent speech Rectal (chaperoned by MA Robin): No external lesions, normal sphincter tone, no fissure, no palpable internal lesions or gross blood.  Assessment: Encounter Diagnoses  Name Primary?  . Rectal bleeding Yes  . History  of colon cancer     The bleeding sounds benign, likely small hemorrhoids. Nothing on exam, it was self-limited and has occurred infrequently.  Plan:  Surveillance colonoscopy on her about October 2018. See [place] in the meantime as needed.  Thank you for the courtesy of this consult.  Please call me with any questions or concerns.  Nelida Meuse III

## 2015-11-12 ENCOUNTER — Ambulatory Visit (INDEPENDENT_AMBULATORY_CARE_PROVIDER_SITE_OTHER): Payer: Medicare Other | Admitting: Internal Medicine

## 2015-11-12 ENCOUNTER — Encounter: Payer: Self-pay | Admitting: Internal Medicine

## 2015-11-12 VITALS — BP 136/84 | HR 64 | Ht 62.0 in | Wt 148.0 lb

## 2015-11-12 DIAGNOSIS — J4530 Mild persistent asthma, uncomplicated: Secondary | ICD-10-CM | POA: Diagnosis not present

## 2015-11-12 NOTE — Assessment & Plan Note (Signed)
Well Controlled Asthma Plan Continue your Pulmicort one puff twice daily. Pro Air every 4-6 hours as needed for wheezing or shortness of breath. Continue your Flonase for allergies as needed. Follow up with Dr. Chase Caller in 9 months for routine check up. Flu shot in the fall. Please contact office for sooner follow up if symptoms do not improve or worsen or seek emergency care

## 2015-11-12 NOTE — Progress Notes (Signed)
Subjective:    Patient ID: Jasmine Velez, female    DOB: [birth year], 75 y.o.   MRN: [id number]  HPI 75 year old female referred by Dr. Maudie Mercury for history of asthma for 30 years.  11/12/2015 9 month follow up OV for asthma. ACQ Score = 1: ( Wheezing hardly any of the time the past week) She is doing well, no flares. Well maintained on current regimen. She is compliant with her Pulmicort daily, and is using her [employer] as rescue.She is up to date of all vaccinations. She denies wheezing,but does notice with the weather changes she has some faint rare wheezing. She denies chest pain, orthopnea, hemoptysis, leg or calf pain.   Current outpatient prescriptions:  .  albuterol (PROVENTIL) (2.5 MG/3ML) 0.083% nebulizer solution, Take 2.5 mg by nebulization every 6 (six) hours as needed for wheezing or shortness of breath., Disp: , Rfl:  .  budesonide (PULMICORT) 180 MCG/ACT inhaler, Inhale 1 puff into the lungs 2 (two) times daily. , Disp: , Rfl:  .  fluticasone (FLONASE) 50 MCG/ACT nasal spray, USE 2 SPRAYS IN EACH NOSTRIL EVERY DAY, Disp: 16 g, Rfl: 2 .  hydrochlorothiazide (HYDRODIURIL) 25 MG tablet, Take 25 mg by mouth daily as needed., Disp: , Rfl:  .  multivitamin-lutein (OCUVITE-LUTEIN) CAPS capsule, Take 1 capsule by mouth daily., Disp: , Rfl:  .  Pitavastatin Calcium (LIVALO) 2 MG TABS, Take 1 tablet by mouth daily., Disp: , Rfl:  .  RABEprazole (ACIPHEX) 20 MG tablet, Take 20 mg by mouth daily., Disp: , Rfl:    Past Medical History  Diagnosis Date  . Asthma   . GERD (gastroesophageal reflux disease)   . Sciatica   . UTI (lower urinary tract infection)   . PUD (peptic ulcer disease)     Allergies  Allergen Reactions  . Ceclor [Cefaclor] Nausea Only    Gerd  . Nexium [Esomeprazole Magnesium] Nausea Only    sick  . Tramadol   . Erythromycin Rash    In mouth  . Penicillins Rash    In her moth    Review of Systems Constitutional:   No  weight loss, night sweats,  Fevers, chills,  fatigue, or  lassitude.  HEENT:   No headaches,  Difficulty swallowing,  Tooth/dental problems, or  Sore throat,                No sneezing, itching, ear ache, nasal congestion, post nasal drip,   CV:  No chest pain,  Orthopnea, PND, swelling in lower extremities, anasarca, dizziness, palpitations, syncope.   GI  No heartburn, indigestion, abdominal pain, nausea, vomiting, diarrhea, change in bowel habits, loss of appetite, bloody stools.   Resp: No shortness of breath with exertion or at rest.  No excess mucus, no productive cough,  No non-productive cough,  No coughing up of blood.  No change in color of mucus.  No wheezing.  No chest wall deformity  Skin: no rash or lesions.  GU: no dysuria, change in color of urine, no urgency or frequency.  No flank pain, no hematuria   MS:  No joint pain or swelling.  No decreased range of motion.  No back pain.  Psych:  No change in mood or affect. No depression or anxiety.  No memory loss.        Objective:   Physical Exam  BP 136/84 mmHg  Pulse 64  Ht 5\' 2"  (1.575 m)  Wt 148 lb (67.132 kg)  BMI 27.06  kg/m2  SpO2 98%   Physical Exam:  General- No distress,  A&Ox3, ENT: No sinus tenderness, TM clear, pale nasal mucosa, no oral exudate,no post nasal drip, no LAN Cardiac: S1, S2, regular rate and rhythm, no murmur Chest: No wheeze/ rales/ dullness; no accessory muscle use, no nasal flaring, no sternal retractions Abd.: Soft Non-tender Ext: No clubbing cyanosis, edema Neuro:  normal strength Skin: No rashes, warm and dry Psych: normal mood and behavior  Jasmine Velez, AGACNP-BC [facility] 11/12/2015     Assessment & Plan:     ICD-9-CM ICD-10-CM   1. Mild persistent asthma, uncomplicated [id number]    t is nice to meet you today. Continue your Pulmicort one puff twice daily. Pro Air every 4-6 hours as needed for wheezing or shortness of breath. Continue your Flonase for allergies as  needed. Follow up with Dr. Chase Caller in 9 months for routine check up. Flu shot in the fall. Please contact office for sooner follow up if symptoms do not improve or worsen or seek emergency care   Jasmine Velez   STAFF NOTE: I, Dr Ann Lions have personally reviewed patient's available data, including medical history, events of note, physical examination and test results as part of my evaluation. I have discussed with resident/NP and other care providers such as pharmacist, RN and RRT.  In addition,  I personally evaluated patient and elicited key findings of   S: Doing well without complaints. She continues Pulmicort. Asthma is well controlled according to history  O: Looks well. Pleasant and oriented.  A: Mild persistent asthma well controlled  P: Continue Pulmicort. Flu shot in the fall Follow-up 9 months   .  Rest per NP/medical resident whose note is outlined above and that I agree with   Dr. Brand Males, M.D., [facility].C.P Pulmonary and Critical Care Medicine Staff Physician [facility] Pager: [phone], If no answer or between  15:00h - 7:00h: call [phone]  11/12/2015 5:12 PM

## 2015-11-12 NOTE — Patient Instructions (Addendum)
t is nice to meet you today. Continue your Pulmicort one puff twice daily. Pro Air every 4-6 hours as needed for wheezing or shortness of breath. Continue your Flonase for allergies as needed. Follow up with Dr. Chase Caller in 9 months for routine check up. Flu shot in the fall. Please contact office for sooner follow up if symptoms do not improve or worsen or seek emergency care

## 2015-11-29 DIAGNOSIS — E785 Hyperlipidemia, unspecified: Secondary | ICD-10-CM | POA: Diagnosis not present

## 2015-11-29 DIAGNOSIS — Z0000 Encounter for general adult medical examination without abnormal findings: Secondary | ICD-10-CM | POA: Diagnosis not present

## 2015-11-29 DIAGNOSIS — K039 Disease of hard tissues of teeth, unspecified: Secondary | ICD-10-CM | POA: Diagnosis not present

## 2015-11-29 DIAGNOSIS — E039 Hypothyroidism, unspecified: Secondary | ICD-10-CM | POA: Diagnosis not present

## 2015-11-29 DIAGNOSIS — E559 Vitamin D deficiency, unspecified: Secondary | ICD-10-CM | POA: Diagnosis not present

## 2015-12-05 DIAGNOSIS — Z23 Encounter for immunization: Secondary | ICD-10-CM | POA: Diagnosis not present

## 2015-12-05 DIAGNOSIS — E785 Hyperlipidemia, unspecified: Secondary | ICD-10-CM | POA: Diagnosis not present

## 2015-12-05 DIAGNOSIS — J040 Acute laryngitis: Secondary | ICD-10-CM | POA: Diagnosis not present

## 2015-12-05 DIAGNOSIS — E559 Vitamin D deficiency, unspecified: Secondary | ICD-10-CM | POA: Diagnosis not present

## 2015-12-05 DIAGNOSIS — J45909 Unspecified asthma, uncomplicated: Secondary | ICD-10-CM | POA: Diagnosis not present

## 2016-04-02 DIAGNOSIS — R05 Cough: Secondary | ICD-10-CM | POA: Diagnosis not present

## 2016-04-02 DIAGNOSIS — N390 Urinary tract infection, site not specified: Secondary | ICD-10-CM | POA: Diagnosis not present

## 2016-06-04 DIAGNOSIS — E785 Hyperlipidemia, unspecified: Secondary | ICD-10-CM | POA: Diagnosis not present

## 2016-06-04 DIAGNOSIS — E559 Vitamin D deficiency, unspecified: Secondary | ICD-10-CM | POA: Diagnosis not present

## 2016-06-10 DIAGNOSIS — E559 Vitamin D deficiency, unspecified: Secondary | ICD-10-CM | POA: Diagnosis not present

## 2016-06-10 DIAGNOSIS — E785 Hyperlipidemia, unspecified: Secondary | ICD-10-CM | POA: Diagnosis not present

## 2016-06-10 DIAGNOSIS — J45909 Unspecified asthma, uncomplicated: Secondary | ICD-10-CM | POA: Diagnosis not present

## 2016-06-10 DIAGNOSIS — Z0000 Encounter for general adult medical examination without abnormal findings: Secondary | ICD-10-CM | POA: Diagnosis not present

## 2016-07-16 ENCOUNTER — Other Ambulatory Visit: Payer: Self-pay | Admitting: Internal Medicine

## 2016-08-12 DIAGNOSIS — M25561 Pain in right knee: Secondary | ICD-10-CM | POA: Diagnosis not present

## 2016-08-12 DIAGNOSIS — M25552 Pain in left hip: Secondary | ICD-10-CM | POA: Diagnosis not present

## 2016-08-12 DIAGNOSIS — M25562 Pain in left knee: Secondary | ICD-10-CM | POA: Diagnosis not present

## 2016-08-12 DIAGNOSIS — M67911 Unspecified disorder of synovium and tendon, right shoulder: Secondary | ICD-10-CM | POA: Diagnosis not present

## 2016-08-13 ENCOUNTER — Other Ambulatory Visit (HOSPITAL_COMMUNITY): Payer: Self-pay | Admitting: Orthopedic Surgery

## 2016-08-13 DIAGNOSIS — M25552 Pain in left hip: Secondary | ICD-10-CM

## 2016-08-21 ENCOUNTER — Encounter (HOSPITAL_COMMUNITY)
Admission: RE | Admit: 2016-08-21 | Discharge: 2016-08-21 | Disposition: A | Discharge status: Home / Self Care | Payer: Medicare Other | Source: Ambulatory Visit | Attending: Orthopedic Surgery | Admitting: Orthopedic Surgery

## 2016-08-21 DIAGNOSIS — M25552 Pain in left hip: Secondary | ICD-10-CM

## 2016-08-21 DIAGNOSIS — M79652 Pain in left thigh: Secondary | ICD-10-CM | POA: Diagnosis not present

## 2016-08-21 MED ORDER — TECHNETIUM TC 99M MEDRONATE IV KIT
25.0000 | PACK | Freq: Once | INTRAVENOUS | Status: AC | PRN
  Administered 2016-08-21: 25 via INTRAVENOUS

## 2016-08-28 DIAGNOSIS — M7062 Trochanteric bursitis, left hip: Secondary | ICD-10-CM | POA: Diagnosis not present

## 2016-10-01 DIAGNOSIS — Z961 Presence of intraocular lens: Secondary | ICD-10-CM | POA: Diagnosis not present

## 2016-10-01 DIAGNOSIS — H04123 Dry eye syndrome of bilateral lacrimal glands: Secondary | ICD-10-CM | POA: Diagnosis not present

## 2016-10-01 DIAGNOSIS — H353131 Nonexudative age-related macular degeneration, bilateral, early dry stage: Secondary | ICD-10-CM | POA: Diagnosis not present

## 2016-10-06 DIAGNOSIS — M7061 Trochanteric bursitis, right hip: Secondary | ICD-10-CM | POA: Diagnosis not present

## 2016-10-06 DIAGNOSIS — M7062 Trochanteric bursitis, left hip: Secondary | ICD-10-CM | POA: Diagnosis not present

## 2016-10-06 DIAGNOSIS — M67911 Unspecified disorder of synovium and tendon, right shoulder: Secondary | ICD-10-CM | POA: Diagnosis not present

## 2016-11-03 DIAGNOSIS — M7581 Other shoulder lesions, right shoulder: Secondary | ICD-10-CM | POA: Diagnosis not present

## 2016-11-03 DIAGNOSIS — M67911 Unspecified disorder of synovium and tendon, right shoulder: Secondary | ICD-10-CM | POA: Diagnosis not present

## 2016-11-28 DIAGNOSIS — E785 Hyperlipidemia, unspecified: Secondary | ICD-10-CM | POA: Diagnosis not present

## 2016-11-28 DIAGNOSIS — N390 Urinary tract infection, site not specified: Secondary | ICD-10-CM | POA: Diagnosis not present

## 2016-11-28 DIAGNOSIS — E559 Vitamin D deficiency, unspecified: Secondary | ICD-10-CM | POA: Diagnosis not present

## 2016-11-28 DIAGNOSIS — Z0000 Encounter for general adult medical examination without abnormal findings: Secondary | ICD-10-CM | POA: Diagnosis not present

## 2016-12-01 DIAGNOSIS — M67911 Unspecified disorder of synovium and tendon, right shoulder: Secondary | ICD-10-CM | POA: Diagnosis not present

## 2016-12-03 DIAGNOSIS — M25511 Pain in right shoulder: Secondary | ICD-10-CM | POA: Diagnosis not present

## 2016-12-05 DIAGNOSIS — K219 Gastro-esophageal reflux disease without esophagitis: Secondary | ICD-10-CM | POA: Diagnosis not present

## 2016-12-05 DIAGNOSIS — N390 Urinary tract infection, site not specified: Secondary | ICD-10-CM | POA: Diagnosis not present

## 2016-12-05 DIAGNOSIS — E7800 Pure hypercholesterolemia, unspecified: Secondary | ICD-10-CM | POA: Diagnosis not present

## 2016-12-05 DIAGNOSIS — R0989 Other specified symptoms and signs involving the circulatory and respiratory systems: Secondary | ICD-10-CM | POA: Diagnosis not present

## 2016-12-10 ENCOUNTER — Other Ambulatory Visit: Payer: Self-pay | Admitting: Internal Medicine

## 2016-12-10 DIAGNOSIS — R0989 Other specified symptoms and signs involving the circulatory and respiratory systems: Secondary | ICD-10-CM

## 2016-12-15 ENCOUNTER — Other Ambulatory Visit: Payer: Self-pay | Admitting: Internal Medicine

## 2016-12-15 ENCOUNTER — Ambulatory Visit
Admission: RE | Admit: 2016-12-15 | Discharge: 2016-12-15 | Disposition: A | Discharge status: Home / Self Care | Payer: Medicare Other | Source: Ambulatory Visit | Attending: Internal Medicine | Admitting: Internal Medicine

## 2016-12-15 DIAGNOSIS — Z1231 Encounter for screening mammogram for malignant neoplasm of breast: Secondary | ICD-10-CM

## 2016-12-15 DIAGNOSIS — I6523 Occlusion and stenosis of bilateral carotid arteries: Secondary | ICD-10-CM | POA: Diagnosis not present

## 2016-12-15 DIAGNOSIS — R0989 Other specified symptoms and signs involving the circulatory and respiratory systems: Secondary | ICD-10-CM

## 2016-12-18 DIAGNOSIS — M25511 Pain in right shoulder: Secondary | ICD-10-CM | POA: Diagnosis not present

## 2016-12-30 ENCOUNTER — Ambulatory Visit
Admission: RE | Admit: 2016-12-30 | Discharge: 2016-12-30 | Disposition: A | Discharge status: Home / Self Care | Payer: Medicare Other | Source: Ambulatory Visit | Attending: Internal Medicine | Admitting: Internal Medicine

## 2016-12-30 DIAGNOSIS — Z1231 Encounter for screening mammogram for malignant neoplasm of breast: Secondary | ICD-10-CM

## 2017-01-05 DIAGNOSIS — M9902 Segmental and somatic dysfunction of thoracic region: Secondary | ICD-10-CM | POA: Diagnosis not present

## 2017-01-05 DIAGNOSIS — M5134 Other intervertebral disc degeneration, thoracic region: Secondary | ICD-10-CM | POA: Diagnosis not present

## 2017-01-05 DIAGNOSIS — M5031 Other cervical disc degeneration,  high cervical region: Secondary | ICD-10-CM | POA: Diagnosis not present

## 2017-01-05 DIAGNOSIS — M9901 Segmental and somatic dysfunction of cervical region: Secondary | ICD-10-CM | POA: Diagnosis not present

## 2017-01-06 DIAGNOSIS — M5134 Other intervertebral disc degeneration, thoracic region: Secondary | ICD-10-CM | POA: Diagnosis not present

## 2017-01-06 DIAGNOSIS — M9901 Segmental and somatic dysfunction of cervical region: Secondary | ICD-10-CM | POA: Diagnosis not present

## 2017-01-06 DIAGNOSIS — M9902 Segmental and somatic dysfunction of thoracic region: Secondary | ICD-10-CM | POA: Diagnosis not present

## 2017-01-06 DIAGNOSIS — M5031 Other cervical disc degeneration,  high cervical region: Secondary | ICD-10-CM | POA: Diagnosis not present

## 2017-01-07 DIAGNOSIS — M5134 Other intervertebral disc degeneration, thoracic region: Secondary | ICD-10-CM | POA: Diagnosis not present

## 2017-01-07 DIAGNOSIS — M5031 Other cervical disc degeneration,  high cervical region: Secondary | ICD-10-CM | POA: Diagnosis not present

## 2017-01-07 DIAGNOSIS — M9902 Segmental and somatic dysfunction of thoracic region: Secondary | ICD-10-CM | POA: Diagnosis not present

## 2017-01-07 DIAGNOSIS — M9901 Segmental and somatic dysfunction of cervical region: Secondary | ICD-10-CM | POA: Diagnosis not present

## 2017-01-08 DIAGNOSIS — M5031 Other cervical disc degeneration,  high cervical region: Secondary | ICD-10-CM | POA: Diagnosis not present

## 2017-01-08 DIAGNOSIS — M9902 Segmental and somatic dysfunction of thoracic region: Secondary | ICD-10-CM | POA: Diagnosis not present

## 2017-01-08 DIAGNOSIS — M9901 Segmental and somatic dysfunction of cervical region: Secondary | ICD-10-CM | POA: Diagnosis not present

## 2017-01-08 DIAGNOSIS — M5134 Other intervertebral disc degeneration, thoracic region: Secondary | ICD-10-CM | POA: Diagnosis not present

## 2017-01-12 DIAGNOSIS — M9902 Segmental and somatic dysfunction of thoracic region: Secondary | ICD-10-CM | POA: Diagnosis not present

## 2017-01-12 DIAGNOSIS — M5031 Other cervical disc degeneration,  high cervical region: Secondary | ICD-10-CM | POA: Diagnosis not present

## 2017-01-12 DIAGNOSIS — M5134 Other intervertebral disc degeneration, thoracic region: Secondary | ICD-10-CM | POA: Diagnosis not present

## 2017-01-12 DIAGNOSIS — M9901 Segmental and somatic dysfunction of cervical region: Secondary | ICD-10-CM | POA: Diagnosis not present

## 2017-01-22 DIAGNOSIS — L304 Erythema intertrigo: Secondary | ICD-10-CM | POA: Diagnosis not present

## 2017-01-26 DIAGNOSIS — M9901 Segmental and somatic dysfunction of cervical region: Secondary | ICD-10-CM | POA: Diagnosis not present

## 2017-01-26 DIAGNOSIS — M5031 Other cervical disc degeneration,  high cervical region: Secondary | ICD-10-CM | POA: Diagnosis not present

## 2017-01-26 DIAGNOSIS — M9902 Segmental and somatic dysfunction of thoracic region: Secondary | ICD-10-CM | POA: Diagnosis not present

## 2017-01-26 DIAGNOSIS — M5134 Other intervertebral disc degeneration, thoracic region: Secondary | ICD-10-CM | POA: Diagnosis not present

## 2017-03-02 DIAGNOSIS — E559 Vitamin D deficiency, unspecified: Secondary | ICD-10-CM | POA: Diagnosis not present

## 2017-03-02 DIAGNOSIS — E7800 Pure hypercholesterolemia, unspecified: Secondary | ICD-10-CM | POA: Diagnosis not present

## 2017-03-09 DIAGNOSIS — E785 Hyperlipidemia, unspecified: Secondary | ICD-10-CM | POA: Diagnosis not present

## 2017-03-09 DIAGNOSIS — E559 Vitamin D deficiency, unspecified: Secondary | ICD-10-CM | POA: Diagnosis not present

## 2017-03-09 DIAGNOSIS — I6521 Occlusion and stenosis of right carotid artery: Secondary | ICD-10-CM | POA: Diagnosis not present

## 2017-03-20 DIAGNOSIS — M9902 Segmental and somatic dysfunction of thoracic region: Secondary | ICD-10-CM | POA: Diagnosis not present

## 2017-03-20 DIAGNOSIS — M5134 Other intervertebral disc degeneration, thoracic region: Secondary | ICD-10-CM | POA: Diagnosis not present

## 2017-04-15 DIAGNOSIS — M9903 Segmental and somatic dysfunction of lumbar region: Secondary | ICD-10-CM | POA: Diagnosis not present

## 2017-04-15 DIAGNOSIS — M9905 Segmental and somatic dysfunction of pelvic region: Secondary | ICD-10-CM | POA: Diagnosis not present

## 2017-04-15 DIAGNOSIS — M5136 Other intervertebral disc degeneration, lumbar region: Secondary | ICD-10-CM | POA: Diagnosis not present

## 2017-04-15 DIAGNOSIS — M9904 Segmental and somatic dysfunction of sacral region: Secondary | ICD-10-CM | POA: Diagnosis not present

## 2017-04-16 DIAGNOSIS — M9904 Segmental and somatic dysfunction of sacral region: Secondary | ICD-10-CM | POA: Diagnosis not present

## 2017-04-16 DIAGNOSIS — M5136 Other intervertebral disc degeneration, lumbar region: Secondary | ICD-10-CM | POA: Diagnosis not present

## 2017-04-16 DIAGNOSIS — M9903 Segmental and somatic dysfunction of lumbar region: Secondary | ICD-10-CM | POA: Diagnosis not present

## 2017-04-16 DIAGNOSIS — M9905 Segmental and somatic dysfunction of pelvic region: Secondary | ICD-10-CM | POA: Diagnosis not present

## 2017-04-20 ENCOUNTER — Other Ambulatory Visit: Payer: Self-pay | Admitting: Internal Medicine

## 2017-04-20 DIAGNOSIS — M9905 Segmental and somatic dysfunction of pelvic region: Secondary | ICD-10-CM | POA: Diagnosis not present

## 2017-04-20 DIAGNOSIS — M9903 Segmental and somatic dysfunction of lumbar region: Secondary | ICD-10-CM | POA: Diagnosis not present

## 2017-04-20 DIAGNOSIS — M9904 Segmental and somatic dysfunction of sacral region: Secondary | ICD-10-CM | POA: Diagnosis not present

## 2017-04-20 DIAGNOSIS — M5136 Other intervertebral disc degeneration, lumbar region: Secondary | ICD-10-CM | POA: Diagnosis not present

## 2017-04-22 DIAGNOSIS — M5136 Other intervertebral disc degeneration, lumbar region: Secondary | ICD-10-CM | POA: Diagnosis not present

## 2017-04-22 DIAGNOSIS — M9905 Segmental and somatic dysfunction of pelvic region: Secondary | ICD-10-CM | POA: Diagnosis not present

## 2017-04-22 DIAGNOSIS — M9904 Segmental and somatic dysfunction of sacral region: Secondary | ICD-10-CM | POA: Diagnosis not present

## 2017-04-22 DIAGNOSIS — M9903 Segmental and somatic dysfunction of lumbar region: Secondary | ICD-10-CM | POA: Diagnosis not present

## 2017-05-04 DIAGNOSIS — M9903 Segmental and somatic dysfunction of lumbar region: Secondary | ICD-10-CM | POA: Diagnosis not present

## 2017-05-04 DIAGNOSIS — M9905 Segmental and somatic dysfunction of pelvic region: Secondary | ICD-10-CM | POA: Diagnosis not present

## 2017-05-04 DIAGNOSIS — M9904 Segmental and somatic dysfunction of sacral region: Secondary | ICD-10-CM | POA: Diagnosis not present

## 2017-05-04 DIAGNOSIS — M5136 Other intervertebral disc degeneration, lumbar region: Secondary | ICD-10-CM | POA: Diagnosis not present

## 2017-05-06 ENCOUNTER — Ambulatory Visit: Payer: Medicare Other | Admitting: Internal Medicine

## 2017-05-07 DIAGNOSIS — M5136 Other intervertebral disc degeneration, lumbar region: Secondary | ICD-10-CM | POA: Diagnosis not present

## 2017-05-07 DIAGNOSIS — M9905 Segmental and somatic dysfunction of pelvic region: Secondary | ICD-10-CM | POA: Diagnosis not present

## 2017-05-07 DIAGNOSIS — M9903 Segmental and somatic dysfunction of lumbar region: Secondary | ICD-10-CM | POA: Diagnosis not present

## 2017-05-07 DIAGNOSIS — M9904 Segmental and somatic dysfunction of sacral region: Secondary | ICD-10-CM | POA: Diagnosis not present

## 2017-05-11 DIAGNOSIS — Z23 Encounter for immunization: Secondary | ICD-10-CM | POA: Diagnosis not present

## 2017-05-18 ENCOUNTER — Ambulatory Visit (INDEPENDENT_AMBULATORY_CARE_PROVIDER_SITE_OTHER): Payer: Medicare Other | Admitting: Internal Medicine

## 2017-05-18 ENCOUNTER — Encounter: Payer: Self-pay | Admitting: Internal Medicine

## 2017-05-18 VITALS — BP 140/78 | HR 64 | Ht 62.0 in | Wt 143.0 lb

## 2017-05-18 DIAGNOSIS — R6889 Other general symptoms and signs: Secondary | ICD-10-CM

## 2017-05-18 DIAGNOSIS — J4530 Mild persistent asthma, uncomplicated: Secondary | ICD-10-CM

## 2017-05-18 DIAGNOSIS — R0989 Other specified symptoms and signs involving the circulatory and respiratory systems: Secondary | ICD-10-CM

## 2017-05-18 LAB — NITRIC OXIDE: Nitric Oxide: 22

## 2017-05-18 MED ORDER — BUDESONIDE 180 MCG/ACT IN AEPB: 1.0000 | INHALATION_SPRAY | Freq: Two times a day (BID) | RESPIRATORY_TRACT | 3 refills | Status: DC

## 2017-05-18 MED ORDER — FLUTICASONE PROPIONATE 50 MCG/ACT NA SUSP: 2.0000 | Freq: Every day | NASAL | 3 refills | Status: DC

## 2017-05-18 NOTE — Progress Notes (Signed)
Subjective:     Patient ID: Jasmine Velez, female   DOB: 02-12-1941, 76 y.o.   MRN: [id number]  HPI    HPI  IOV 08/11/2014  Chief Complaint  Patient presents with  . Pulmonary Consult    Pt referred by Dr. Maudie Mercury for asthma.    76 year old female referred by Dr. Maudie Mercury for asthma. Due to history of asthma for 30 years. She is a new [employer] resident. Asthma symptoms are made worse by not taking her inhalers and also by stress. She used to be followed by a Dr. Gerarda Gunther in [facility]. Asthma is associated with multiple medication allergies but no allergy to aspirin. She reports a diagnosis of asthma made 30 years ago. Since then has been on several inhalers but for the last few to several years has been on Pulmicort Turbuhaler twice daily. Symptoms only mild intermittent with this regimen he did she uses albuterol roughly every 6 weeks. In times of stress of bad weather she could use it a little bit more but never more than 2 times a week. No nocturnal symptoms. Last prednisone use for an exacerbation was several years ago. Currently she is here due to establishing care with a pulmonologist and reports stable health. Review of vaccine history so shows that she likely had Pneumovax many years ago in [place]. She's not had her Prevnar yet. She is up-to-date with her flu shot for this season.  Of note, she has chronic postnasal drainage that is mild and associated chronic clearing of the throat/she also gives a history of stage I colon cancer many years ago status post resection and no chemotherapy  I do not see any x-ray in our system are done by Dr. Maudie Mercury and review of his notes  Spirometry today is normal     11/12/2015 9 month follow up OV for asthma. ACQ Score = 1: ( Wheezing hardly any of the time the past week) She is doing well, no flares. Well maintained on current regimen. She is compliant with her Pulmicort daily, and is using her [employer] as rescue.She is up to date of  all vaccinations. She denies wheezing,but does notice with the weather changes she has some faint rare wheezing. She denies chest pain, orthopnea, hemoptysis, leg or calf pain.   OV 05/18/2017  Chief Complaint  Patient presents with  . Follow-up    Pt states that her asthma is under control and she is happy. Pt has seldom had to use her proventil inhaler. Denies any SOB or CP. States that she has had a sinus cough with nasal drainage mainly in the morning.    Follow-up mild persistent asthma  I personally have not seen her in a long time. Last seen by my nurse partition of May 2017. Since then she's been compliant with her daily Pulmicort if she says if she does not take her Pulmicort in a few days she will get chest tightness and symptoms. Otherwise asthma control questionnaire as of now is 0.2 showing excellent control. The middle of the night she does not wake up from asthma. When she wakes up she does not have any symptoms she's not limited in her activities because of asthma. She has not used her rescue inhaler she has not wheezed. The main problem appears to be some mild left nasal blockage intermittently. She recollects having had sinus surgery/nasal surgery many decades ago in [place]. She is asking for refills. She is up-to-date with her flu shot.  FenO 05/18/2017\ -  22 ppb       has a past medical history of Asthma, GERD (gastroesophageal reflux disease), PUD (peptic ulcer disease), Sciatica, and UTI (lower urinary tract infection).   reports that  has never smoked. she has never used smokeless tobacco.  Past Surgical History:  Procedure Laterality Date  . CATARACT EXTRACTION  2013, 2014  . COLON RESECTION  2008  . LASIK  2000  . NASAL SEPTUM SURGERY  1980's  . PILONIDAL CYST EXCISION  1960  . TONSILLECTOMY  1960  . TOTAL HIP ARTHROPLASTY  1994    Allergies  Allergen Reactions  . Ceclor [Cefaclor] Nausea Only    Gerd  . Nexium [Esomeprazole Magnesium] Nausea Only     sick  . Tramadol   . Erythromycin Rash    In mouth  . Penicillins Rash    In her moth    Immunization History  Administered Date(s) Administered  . Influenza Split 04/13/2014  . Influenza, High Dose Seasonal PF 05/11/2017  . Influenza,inj,Quad PF,6+ Mos 04/14/2015  . Pneumococcal Conjugate-13 08/11/2014    Family History  Problem Relation Age of Onset  . CAD Father   . Dementia Mother   . Dementia Sister      Current Outpatient Medications:  .  albuterol (PROVENTIL) (2.5 MG/3ML) 0.083% nebulizer solution, Take 2.5 mg by nebulization every 6 (six) hours as needed for wheezing or shortness of breath., Disp: , Rfl:  .  budesonide (PULMICORT) 180 MCG/ACT inhaler, Inhale 1 puff into the lungs 2 (two) times daily. , Disp: , Rfl:  .  fluticasone (FLONASE) 50 MCG/ACT nasal spray, USE 2 SPRAYS IN EACH NOSTRIL EVERY DAY, Disp: 16 g, Rfl: 2 .  hydrochlorothiazide (HYDRODIURIL) 25 MG tablet, Take 25 mg by mouth daily as needed., Disp: , Rfl:  .  multivitamin-lutein (OCUVITE-LUTEIN) CAPS capsule, Take 1 capsule by mouth daily., Disp: , Rfl:  .  Pitavastatin Calcium (LIVALO) 2 MG TABS, Take 1 tablet 2 (two) times a week by mouth. , Disp: , Rfl:  .  RABEprazole (ACIPHEX) 20 MG tablet, Take 20 mg by mouth daily., Disp: , Rfl:    Review of Systems     Objective:   Physical Exam  Constitutional: She is oriented to person, place, and time. She appears well-developed and well-nourished. No distress.  HENT:  Head: Normocephalic and atraumatic.  Right Ear: External ear normal.  Left Ear: External ear normal.  Mouth/Throat: Oropharynx is clear and moist. No oropharyngeal exudate.  Mild postnasal drip as before Ongoing mild raspy voice as before  Eyes: Conjunctivae and EOM are normal. Pupils are equal, round, and reactive to light. Right eye exhibits no discharge. Left eye exhibits no discharge. No scleral icterus.  Neck: Normal range of motion. Neck supple. No JVD present. No tracheal  deviation present. No thyromegaly present.  Cardiovascular: Normal rate, regular rhythm, normal heart sounds and intact distal pulses. Exam reveals no gallop and no friction rub.  No murmur heard. Pulmonary/Chest: Effort normal and breath sounds normal. No respiratory distress. She has no wheezes. She has no rales. She exhibits no tenderness.  Abdominal: Soft. Bowel sounds are normal. She exhibits no distension and no mass. There is no tenderness. There is no rebound and no guarding.  Musculoskeletal: Normal range of motion. She exhibits no edema or tenderness.  Lymphadenopathy:    She has no cervical adenopathy.  Neurological: She is alert and oriented to person, place, and time. She has normal reflexes. No cranial nerve deficit. She exhibits normal muscle  tone. Coordination normal.  Skin: Skin is warm and dry. No rash noted. She is not diaphoretic. No erythema. No pallor.  Psychiatric: She has a normal mood and affect. Her behavior is normal. Judgment and thought content normal.  Vitals reviewed.   Vitals:   05/18/17 1058  BP: 140/78  Pulse: 64  SpO2: 97%  Weight: 143 lb (64.9 kg)  Height: 5\' 2"  (1.575 m)    Estimated body mass index is 26.16 kg/m as calculated from the following:   Height as of this encounter: 5\' 2"  (1.575 m).   Weight as of this encounter: 143 lb (64.9 kg).        Assessment:       ICD-10-CM   1. Mild persistent asthma without complication H67.59   2. Chronic throat clearing R68.89        Plan:      Stable disease  Plan - continue pulmicort  And nasal steroid as before - if left nasal blockage an ongoing problem please let us know and we can refer to ENT - glad uptodate with flu shot  Followup 1 year or sooner if needed   Dr. Brand Males, M.D., [facility].C.P Pulmonary and Critical Care Medicine Staff Physician [facility] Pager: [phone], If no answer or between  15:00h - 7:00h: call [phone]  05/18/2017 11:24 AM

## 2017-05-18 NOTE — Patient Instructions (Signed)
ICD-10-CM   1. Mild persistent asthma without complication M84.13   2. Chronic throat clearing R68.89     Stable disease  Plan - continue pulmicort  And nasal steroid as before - if left nasal blockage an ongoing problem please let us know and we can refer to ENT - glad uptodate with flu shot  Followup 1 year or sooner if needed

## 2017-06-01 DIAGNOSIS — M9905 Segmental and somatic dysfunction of pelvic region: Secondary | ICD-10-CM | POA: Diagnosis not present

## 2017-06-01 DIAGNOSIS — M5136 Other intervertebral disc degeneration, lumbar region: Secondary | ICD-10-CM | POA: Diagnosis not present

## 2017-06-01 DIAGNOSIS — M9903 Segmental and somatic dysfunction of lumbar region: Secondary | ICD-10-CM | POA: Diagnosis not present

## 2017-06-01 DIAGNOSIS — M9904 Segmental and somatic dysfunction of sacral region: Secondary | ICD-10-CM | POA: Diagnosis not present

## 2017-06-02 DIAGNOSIS — E785 Hyperlipidemia, unspecified: Secondary | ICD-10-CM | POA: Diagnosis not present

## 2017-06-11 DIAGNOSIS — E7800 Pure hypercholesterolemia, unspecified: Secondary | ICD-10-CM | POA: Diagnosis not present

## 2017-06-11 DIAGNOSIS — K219 Gastro-esophageal reflux disease without esophagitis: Secondary | ICD-10-CM | POA: Diagnosis not present

## 2017-06-11 DIAGNOSIS — E559 Vitamin D deficiency, unspecified: Secondary | ICD-10-CM | POA: Diagnosis not present

## 2017-06-12 ENCOUNTER — Ambulatory Visit: Payer: Medicare Other | Admitting: Gastroenterology

## 2017-06-12 ENCOUNTER — Encounter: Payer: Self-pay | Admitting: Gastroenterology

## 2017-06-12 ENCOUNTER — Ambulatory Visit (INDEPENDENT_AMBULATORY_CARE_PROVIDER_SITE_OTHER): Payer: Medicare Other | Admitting: Gastroenterology

## 2017-06-12 VITALS — BP 118/62 | HR 74 | Ht 62.0 in | Wt 141.2 lb

## 2017-06-12 DIAGNOSIS — K219 Gastro-esophageal reflux disease without esophagitis: Secondary | ICD-10-CM | POA: Diagnosis not present

## 2017-06-12 DIAGNOSIS — Z85038 Personal history of other malignant neoplasm of large intestine: Secondary | ICD-10-CM | POA: Diagnosis not present

## 2017-06-12 NOTE — Progress Notes (Signed)
[facility] Progress Note  Chief Complaint: GERD  Subjective  History:  This is a 76 year old woman I met about 18 months ago when she had a couple episodes of what seemed to be benign rectal bleeding.  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  She is up-to-date on colorectal cancer screening, most recent colonoscopy in [place] in 2013. She reports that she has an episode of rectal bleeding about twice a year. However, she has been bothered by intermittent heartburn symptoms for many years.  She recalls having had an upper endoscopy with her most recent colonoscopy, and believe she had a hiatal hernia but cannot recall any other findings.  She will take a PPI on occasion for the symptoms, but they cause her some concern because she read the reflux could cause long-term problems or even put her at risk for cancer. She denies dysphagia odynophagia, nausea, vomiting, early satiety or weight loss.  She lost her husband 11 years ago, but is quite happy these days after having found a boyfriend with whom she enjoys spending time.  ROS: Cardiovascular:  no chest pain Respiratory: no dyspnea  The patient's Past Medical, Family and Social History were reviewed and are on file in the EMR.  Objective:  Med list reviewed  Current Outpatient Medications:  .  albuterol (PROVENTIL) (2.5 MG/3ML) 0.083% nebulizer solution, Take 2.5 mg by nebulization every 6 (six) hours as needed for wheezing or shortness of breath., Disp: , Rfl:  .  budesonide (PULMICORT) 180 MCG/ACT inhaler, Inhale 1 puff 2 (two) times daily into the lungs., Disp: 1 Inhaler, Rfl: 3 .  fluticasone (FLONASE) 50 MCG/ACT nasal spray, Place 2 sprays daily into both nostrils., Disp: 16 g, Rfl: 3 .  hydrochlorothiazide (HYDRODIURIL) 25 MG tablet, Take 25 mg by mouth daily as needed., Disp: , Rfl:  .  multivitamin-lutein (OCUVITE-LUTEIN) CAPS capsule, Take  1 capsule by mouth daily., Disp: , Rfl:  .  Pitavastatin Calcium (LIVALO) 2 MG TABS, Take 1 tablet 2 (two) times a week by mouth. , Disp: , Rfl:  .  RABEprazole (ACIPHEX) 20 MG tablet, Take 20 mg by mouth daily., Disp: , Rfl:    Vital signs in last 24 hrs: Vitals:   06/12/17 1042  BP: 118/62  Pulse: 74    Physical Exam    HEENT: sclera anicteric, oral mucosa moist without lesions  Neck: supple, no thyromegaly, JVD or lymphadenopathy  Cardiac: RRR without murmurs, S1S2 heard, no peripheral edema  Pulm: clear to auscultation bilaterally, normal RR and effort noted  Abdomen: soft, no tenderness, with active bowel sounds. No guarding or palpable hepatosplenomegaly.  Skin; warm and dry, no jaundice or rash  '@ASSESSMENTPLANBEGIN'$ @ Assessment: Encounter Diagnoses  Name Primary?  . Gastroesophageal reflux disease, esophagitis presence not specified Yes  . Personal history of colon cancer     Current symptoms without red flag signs.  She has not been taking PPI regularly, but is not clear if she needs it.  She has some concerns about the possible long-term effect of reflux. Personal history of colon cancer, due for a surveillance colonoscopy.  Plan:  EGD and colonoscopy  The benefits and risks of the planned procedure were described in detail with the patient or (when appropriate) their health care proxy.  Risks were outlined as including, but not limited to, bleeding, infection, perforation, adverse medication reaction leading to cardiac or pulmonary decompensation, or pancreatitis (  if ERCP).  The limitation of incomplete mucosal visualization was also discussed.  No guarantees or warranties were given.   Total time 15 minutes, over half spent in counseling and coordination of care.   Nelida Meuse III

## 2017-06-12 NOTE — Patient Instructions (Signed)
If you are age 78 or older, your body mass index should be between 23-30. Your Body mass index is 25.83 kg/m. If this is out of the aforementioned range listed, please consider follow up with your Primary Care Provider.  If you are age 39 or younger, your body mass index should be between 19-25. Your Body mass index is 25.83 kg/m. If this is out of the aformentioned range listed, please consider follow up with your Primary Care Provider.   You have been scheduled for an endoscopy and colonoscopy. Please follow the written instructions given to you at your visit today. Please pick up your prep supplies at the pharmacy within the next 1-3 days. If you use inhalers (even only as needed), please bring them with you on the day of your procedure. Your physician has requested that you go to www.startemmi.com and enter the access code given to you at your visit today. This web site gives a general overview about your procedure. However, you should still follow specific instructions given to you by our office regarding your preparation for the procedure.  Thank you for choosing [facility]  Dr Wilfrid Lund III

## 2017-06-25 ENCOUNTER — Telehealth: Payer: Self-pay | Admitting: Internal Medicine

## 2017-06-25 NOTE — Telephone Encounter (Signed)
Pt states that she was prescribed pulmicort and proair by Dr. Maudie Mercury.  Saw on pt's medication list which has albuterol neb sol on it but pt stated to me that she does not have a neb machine.  Pt states that she has a ProAir inhaler that she uses only in emergency situations but the refills that were on the Rx have expired in May 2018.  Since Dr. Maudie Mercury had prescribed pt the pulmicort and proair, do you want Dr. Maudie Mercury to keep maintaining to these prescriptions for pt or do you want to take over with filling these meds for pt.    Pt refilled pulmicort 05/28/17 which she states was the last refill on her prescription on this. She stated that she would call when she needs this to be refilled again but did state that does need a new Rx of ProAir since her current one has expired so she can have this inhaler on hand if needs it for an emergency.

## 2017-06-25 NOTE — Telephone Encounter (Signed)
Got a note from [place] saying she is not on albuterol as needed for asthma. Please ensure she has non-expired albuterol for rescue. She should continue pulmicort maintenance  Dr. Brand Males, M.D., [facility].C.P Pulmonary and Critical Care Medicine Staff Physician, [place] Director - Interstitial Lung Disease  Program  Pulmonary [place] [facility] [place], [place], [address]  Pager: [phone], If no answer or between  15:00h - 7:00h: call [phone] Telephone: [phone]

## 2017-06-26 NOTE — Telephone Encounter (Signed)
Just refill her pro-air that is really what [employer] wa getting at Either Jasmine Velez or [place] can do the pulmicort and pro-air

## 2017-07-01 MED ORDER — ALBUTEROL SULFATE HFA 108 (90 BASE) MCG/ACT IN AERS: 1.0000 | INHALATION_SPRAY | Freq: Four times a day (QID) | RESPIRATORY_TRACT | 5 refills | Status: DC | PRN

## 2017-07-01 NOTE — Telephone Encounter (Signed)
Sent in a rx of proair for patient. Nothing further needed.

## 2017-07-20 ENCOUNTER — Telehealth: Payer: Self-pay | Admitting: Gastroenterology

## 2017-07-20 NOTE — Telephone Encounter (Signed)
Spoke to patient, she has had more GERD last two days. She only takes her Aciphex prn. She has taken it the last two days, it has helped some. She describes symptoms as "glitch" more on right side of throat, it's relieved by belching. Patient was concerned that this had to do with her right carotid artery bloackage (see US carotid 6/18). She denies any symptoms of dizziness, visual disturbances, muscle weakness. I let her know I would send Dr. Loletha Carrow a message, she has ECL on 08/20/17, let her know that he may want her to take the PPI daily until she has her procedure but would let her know his recommendations.

## 2017-07-21 NOTE — Telephone Encounter (Signed)
Thanks for letting me know. Not sure if those symptoms are from reflux, but I don't object if she wants to take the Aciphex for 7-10 days and see if it helps.

## 2017-07-21 NOTE — Telephone Encounter (Signed)
Patient advised to continue with Aciphex for 7-10 days if needed. She does state that her symptoms are better today, she did take the medication today. She will let us know if symptoms return or worsen.

## 2017-08-06 ENCOUNTER — Ambulatory Visit (AMBULATORY_SURGERY_CENTER): Payer: Self-pay | Admitting: *Deleted

## 2017-08-06 ENCOUNTER — Other Ambulatory Visit: Payer: Self-pay

## 2017-08-06 ENCOUNTER — Encounter: Payer: Medicare Other | Admitting: Gastroenterology

## 2017-08-06 VITALS — Ht 62.0 in | Wt 140.0 lb

## 2017-08-06 DIAGNOSIS — Z85038 Personal history of other malignant neoplasm of large intestine: Secondary | ICD-10-CM

## 2017-08-06 DIAGNOSIS — K219 Gastro-esophageal reflux disease without esophagitis: Secondary | ICD-10-CM

## 2017-08-06 NOTE — Progress Notes (Signed)
No egg or soy allergy known to patient  No issues with past sedation with any surgeries  or procedures, no intubation problems  No diet pills per patient No home 02 use per patient  No blood thinners per patient  Pt denies issues with constipation  No A fib or A flutter  EMMI video sent to pt's e mail - pt declined  Pt has a Plenvu kit at home

## 2017-08-20 ENCOUNTER — Other Ambulatory Visit: Payer: Self-pay

## 2017-08-20 ENCOUNTER — Ambulatory Visit (AMBULATORY_SURGERY_CENTER): Payer: Medicare Other | Admitting: Gastroenterology

## 2017-08-20 ENCOUNTER — Encounter: Payer: Self-pay | Admitting: Gastroenterology

## 2017-08-20 VITALS — BP 132/74 | HR 75 | Temp 98.4°F | Resp 16 | Ht 62.0 in | Wt 140.0 lb

## 2017-08-20 DIAGNOSIS — K219 Gastro-esophageal reflux disease without esophagitis: Secondary | ICD-10-CM | POA: Diagnosis not present

## 2017-08-20 DIAGNOSIS — Z85038 Personal history of other malignant neoplasm of large intestine: Secondary | ICD-10-CM | POA: Diagnosis not present

## 2017-08-20 DIAGNOSIS — J45909 Unspecified asthma, uncomplicated: Secondary | ICD-10-CM | POA: Diagnosis not present

## 2017-08-20 MED ORDER — SODIUM CHLORIDE 0.9 % IV SOLN: 500.0000 mL | Freq: Once | INTRAVENOUS | Status: DC

## 2017-08-20 NOTE — Patient Instructions (Signed)
YOU HAD AN ENDOSCOPIC PROCEDURE TODAY AT [facility]:   Refer to the procedure report that was given to you for any specific questions about what was found during the examination.  If the procedure report does not answer your questions, please call your gastroenterologist to clarify.  If you requested that your care partner not be given the details of your procedure findings, then the procedure report has been included in a sealed envelope for you to review at your convenience later.  YOU SHOULD EXPECT: Some feelings of bloating in the abdomen. Passage of more gas than usual.  Walking can help get rid of the air that was put into your GI tract during the procedure and reduce the bloating. If you had a lower endoscopy (such as a colonoscopy or flexible sigmoidoscopy) you may notice spotting of blood in your stool or on the toilet paper. If you underwent a bowel prep for your procedure, you may not have a normal bowel movement for a few days.  Please Note:  You might notice some irritation and congestion in your nose or some drainage.  This is from the oxygen used during your procedure.  There is no need for concern and it should clear up in a day or so.  SYMPTOMS TO REPORT IMMEDIATELY:   Following lower endoscopy (colonoscopy or flexible sigmoidoscopy):  Excessive amounts of blood in the stool  Significant tenderness or worsening of abdominal pains  Swelling of the abdomen that is new, acute  Fever of 100F or higher   Following upper endoscopy (EGD)  Vomiting of blood or coffee ground material  New chest pain or pain under the shoulder blades  Painful or persistently difficult swallowing  New shortness of breath  Fever of 100F or higher  Black, tarry-looking stools  For urgent or emergent issues, a gastroenterologist can be reached at any hour by calling [phone].   DIET:  We do recommend a small meal at first, but then you may proceed to your regular diet.  Drink  plenty of fluids but you should avoid alcoholic beverages for 24 hours.  Try to eat more fiber, and drink plenty of water.  ACTIVITY:  You should plan to take it easy for the rest of today and you should NOT DRIVE or use heavy machinery until tomorrow (because of the sedation medicines used during the test).    FOLLOW UP: Our staff will call the number listed on your records the next business day following your procedure to check on you and address any questions or concerns that you may have regarding the information given to you following your procedure. If we do not reach you, we will leave a message.  However, if you are feeling well and you are not experiencing any problems, there is no need to return our call.  We will assume that you have returned to your regular daily activities without incident.  If any biopsies were taken you will be contacted by phone or by letter within the next 1-3 weeks.  Please call us at [phone] if you have not heard about the biopsies in 3 weeks.    SIGNATURES/CONFIDENTIALITY: You and/or your care partner have signed paperwork which will be entered into your electronic medical record.  These signatures attest to the fact that that the information above on your After Visit Summary has been reviewed and is understood.  Full responsibility of the confidentiality of this discharge information lies with you and/or your care-partner.  Read all of the handouts given to you by your recovery room nurse.

## 2017-08-20 NOTE — Op Note (Signed)
[place] Patient Name: Jasmine Velez Procedure Date: 08/20/2017 1:26 PM MRN: [id number] Endoscopist: Mallie Mussel L. Loletha Carrow , MD Age: 77 Referring MD:  Date of Birth: 10/27/1940 Gender: Female Account #: [id number] Procedure:                Colonoscopy Indications:              High risk colon cancer surveillance: Personal                            history of colon cancer (no polyps on surveillance                            colonoscopy 04/2012) Medicines:                Monitored Anesthesia Care Procedure:                Pre-Anesthesia Assessment:                           - Prior to the procedure, a History and Physical                            was performed, and patient medications and                            allergies were reviewed. The patient's tolerance of                            previous anesthesia was also reviewed. The risks                            and benefits of the procedure and the sedation                            options and risks were discussed with the patient.                            All questions were answered, and informed consent                            was obtained. Prior Anticoagulants: The patient has                            taken no previous anticoagulant or antiplatelet                            agents. ASA Grade Assessment: II - A patient with                            mild systemic disease. After reviewing the risks                            and benefits, the patient was deemed in  satisfactory condition to undergo the procedure.                           After obtaining informed consent, the colonoscope                            was passed under direct vision. Throughout the                            procedure, the patient's blood pressure, pulse, and                            oxygen saturations were monitored continuously. The                            Model PCF-[id number]) scope was  introduced                            through the anus and advanced to the the                            ileocolonic anastomosis. The colonoscopy was                            performed without difficulty. The patient tolerated                            the procedure well. The quality of the bowel                            preparation was excellent. The rectum and                            ileo-colonic anastomosis were photographed. Scope In: 1:40:10 PM Scope Out: 1:50:32 PM Scope Withdrawal Time: 0 hours 8 minutes 14 seconds  Total Procedure Duration: 0 hours 10 minutes 22 seconds  Findings:                 The perianal and digital rectal examinations were                            normal.                           Multiple medium-mouthed diverticula were found in                            the left colon.                           There was evidence of a prior side-to-side                            ileo-colonic anastomosis in the proximal transverse  colon. This was patent and was characterized by                            healthy appearing mucosa.                           The exam was otherwise without abnormality on                            direct and retroflexion views. Complications:            No immediate complications. Estimated Blood Loss:     Estimated blood loss: none. Impression:               - Diverticulosis in the left colon.                           - Patent side-to-side ileo-colonic anastomosis,                            characterized by healthy appearing mucosa.                           - The examination was otherwise normal on direct                            and retroflexion views.                           - No specimens collected. Recommendation:           - Patient has a contact number available for                            emergencies. The signs and symptoms of potential                            delayed complications  were discussed with the                            patient. Return to normal activities tomorrow.                            Written discharge instructions were provided to the                            patient.                           - Resume previous diet.                           - Continue present medications.                           - No repeat routine surveillance colonoscopy due to  age. Estill Cotta. Loletha Carrow, MD 08/20/2017 1:58:49 PM This report has been signed electronically.

## 2017-08-20 NOTE — Op Note (Signed)
[place] Patient Name: Jasmine Velez Procedure Date: 08/20/2017 1:26 PM MRN: [id number] Endoscopist: Mallie Mussel L. Loletha Carrow , MD Age: 77 Referring MD:  Date of Birth: [birth year] Gender: Female Account #: [id number] Procedure:                Upper GI endoscopy Indications:              Esophageal reflux Medicines:                Monitored Anesthesia Care Procedure:                Pre-Anesthesia Assessment:                           - Prior to the procedure, a History and Physical                            was performed, and patient medications and                            allergies were reviewed. The patient's tolerance of                            previous anesthesia was also reviewed. The risks                            and benefits of the procedure and the sedation                            options and risks were discussed with the patient.                            All questions were answered, and informed consent                            was obtained. Prior Anticoagulants: The patient has                            taken no previous anticoagulant or antiplatelet                            agents. ASA Grade Assessment: II - A patient with                            mild systemic disease. After reviewing the risks                            and benefits, the patient was deemed in                            satisfactory condition to undergo the procedure.                           After obtaining informed consent, the endoscope was  passed under direct vision. Throughout the                            procedure, the patient's blood pressure, pulse, and                            oxygen saturations were monitored continuously. The                            Endoscope was introduced through the mouth, and                            advanced to the second part of duodenum. The upper                            GI endoscopy was accomplished without  difficulty.                            The patient tolerated the procedure well. Scope In: Scope Out: Findings:                 The larynx was normal.                           A small hiatal hernia was present.                           The exam of the esophagus was otherwise normal.                           The stomach was normal.                           The cardia and gastric fundus were normal on                            retroflexion.                           The examined duodenum was normal. Complications:            No immediate complications. Estimated Blood Loss:     Estimated blood loss: none. Impression:               - Normal larynx.                           - Small hiatal hernia.                           - Normal stomach.                           - Normal examined duodenum.                           - No specimens collected. Recommendation:           - Patient  has a contact number available for                            emergencies. The signs and symptoms of potential                            delayed complications were discussed with the                            patient. Return to normal activities tomorrow.                            Written discharge instructions were provided to the                            patient.                           - Resume previous diet.                           - Continue present medications.                           - See the other procedure note for documentation of                            additional recommendations. Cortlynn Hollinsworth L. Loletha Carrow, MD 08/20/2017 1:54:32 PM This report has been signed electronically.

## 2017-08-20 NOTE — Progress Notes (Signed)
Report given to PACU, vss 

## 2017-08-21 ENCOUNTER — Telehealth: Payer: Self-pay

## 2017-08-21 NOTE — Telephone Encounter (Signed)
  Follow up Call-  Call back number 08/20/2017  Post procedure Call Back phone  # [phone]  Permission to leave phone message Yes  Some recent data might be hidden     Patient questions:  Do you have a fever, pain , or abdominal swelling? No. Pain Score  0 *  Have you tolerated food without any problems? Yes.    Have you been able to return to your normal activities? Yes.    Do you have any questions about your discharge instructions: Diet   No. Medications  No. Follow up visit  No.  Do you have questions or concerns about your Care? No.  Actions: * If pain score is 4 or above: No action needed, pain <4.  Pt expressed gratitude to staff esp Dr Loletha Carrow.

## 2017-09-28 DIAGNOSIS — E559 Vitamin D deficiency, unspecified: Secondary | ICD-10-CM | POA: Diagnosis not present

## 2017-09-28 DIAGNOSIS — E7800 Pure hypercholesterolemia, unspecified: Secondary | ICD-10-CM | POA: Diagnosis not present

## 2017-10-07 DIAGNOSIS — H353132 Nonexudative age-related macular degeneration, bilateral, intermediate dry stage: Secondary | ICD-10-CM | POA: Diagnosis not present

## 2017-10-07 DIAGNOSIS — H04123 Dry eye syndrome of bilateral lacrimal glands: Secondary | ICD-10-CM | POA: Diagnosis not present

## 2017-10-07 DIAGNOSIS — Z961 Presence of intraocular lens: Secondary | ICD-10-CM | POA: Diagnosis not present

## 2017-10-07 DIAGNOSIS — H26493 Other secondary cataract, bilateral: Secondary | ICD-10-CM | POA: Diagnosis not present

## 2017-10-08 DIAGNOSIS — I6521 Occlusion and stenosis of right carotid artery: Secondary | ICD-10-CM | POA: Diagnosis not present

## 2017-10-08 DIAGNOSIS — Z780 Asymptomatic menopausal state: Secondary | ICD-10-CM | POA: Diagnosis not present

## 2017-10-08 DIAGNOSIS — E785 Hyperlipidemia, unspecified: Secondary | ICD-10-CM | POA: Diagnosis not present

## 2017-10-08 DIAGNOSIS — Z0000 Encounter for general adult medical examination without abnormal findings: Secondary | ICD-10-CM | POA: Diagnosis not present

## 2017-10-08 DIAGNOSIS — E559 Vitamin D deficiency, unspecified: Secondary | ICD-10-CM | POA: Diagnosis not present

## 2017-11-03 DIAGNOSIS — M9901 Segmental and somatic dysfunction of cervical region: Secondary | ICD-10-CM | POA: Diagnosis not present

## 2017-11-03 DIAGNOSIS — M50322 Other cervical disc degeneration at C5-C6 level: Secondary | ICD-10-CM | POA: Diagnosis not present

## 2017-11-04 DIAGNOSIS — M9901 Segmental and somatic dysfunction of cervical region: Secondary | ICD-10-CM | POA: Diagnosis not present

## 2017-11-04 DIAGNOSIS — M50322 Other cervical disc degeneration at C5-C6 level: Secondary | ICD-10-CM | POA: Diagnosis not present

## 2017-11-05 DIAGNOSIS — M9901 Segmental and somatic dysfunction of cervical region: Secondary | ICD-10-CM | POA: Diagnosis not present

## 2017-11-05 DIAGNOSIS — M50322 Other cervical disc degeneration at C5-C6 level: Secondary | ICD-10-CM | POA: Diagnosis not present

## 2017-11-06 DIAGNOSIS — M9901 Segmental and somatic dysfunction of cervical region: Secondary | ICD-10-CM | POA: Diagnosis not present

## 2017-11-06 DIAGNOSIS — M50322 Other cervical disc degeneration at C5-C6 level: Secondary | ICD-10-CM | POA: Diagnosis not present

## 2017-11-09 DIAGNOSIS — M9901 Segmental and somatic dysfunction of cervical region: Secondary | ICD-10-CM | POA: Diagnosis not present

## 2017-11-09 DIAGNOSIS — M50322 Other cervical disc degeneration at C5-C6 level: Secondary | ICD-10-CM | POA: Diagnosis not present

## 2017-11-17 DIAGNOSIS — M9901 Segmental and somatic dysfunction of cervical region: Secondary | ICD-10-CM | POA: Diagnosis not present

## 2017-11-17 DIAGNOSIS — M50322 Other cervical disc degeneration at C5-C6 level: Secondary | ICD-10-CM | POA: Diagnosis not present

## 2017-11-19 DIAGNOSIS — M50322 Other cervical disc degeneration at C5-C6 level: Secondary | ICD-10-CM | POA: Diagnosis not present

## 2017-11-19 DIAGNOSIS — M9901 Segmental and somatic dysfunction of cervical region: Secondary | ICD-10-CM | POA: Diagnosis not present

## 2018-01-06 DIAGNOSIS — M9905 Segmental and somatic dysfunction of pelvic region: Secondary | ICD-10-CM | POA: Diagnosis not present

## 2018-01-06 DIAGNOSIS — M9903 Segmental and somatic dysfunction of lumbar region: Secondary | ICD-10-CM | POA: Diagnosis not present

## 2018-01-06 DIAGNOSIS — M5136 Other intervertebral disc degeneration, lumbar region: Secondary | ICD-10-CM | POA: Diagnosis not present

## 2018-01-06 DIAGNOSIS — M9904 Segmental and somatic dysfunction of sacral region: Secondary | ICD-10-CM | POA: Diagnosis not present

## 2018-01-27 DIAGNOSIS — R350 Frequency of micturition: Secondary | ICD-10-CM | POA: Diagnosis not present

## 2018-01-27 DIAGNOSIS — I10 Essential (primary) hypertension: Secondary | ICD-10-CM | POA: Diagnosis not present

## 2018-01-27 DIAGNOSIS — N390 Urinary tract infection, site not specified: Secondary | ICD-10-CM | POA: Diagnosis not present

## 2018-01-27 DIAGNOSIS — E785 Hyperlipidemia, unspecified: Secondary | ICD-10-CM | POA: Diagnosis not present

## 2018-01-27 DIAGNOSIS — R300 Dysuria: Secondary | ICD-10-CM | POA: Diagnosis not present

## 2018-01-27 DIAGNOSIS — E039 Hypothyroidism, unspecified: Secondary | ICD-10-CM | POA: Diagnosis not present

## 2018-01-27 DIAGNOSIS — K219 Gastro-esophageal reflux disease without esophagitis: Secondary | ICD-10-CM | POA: Diagnosis not present

## 2018-03-09 DIAGNOSIS — M9901 Segmental and somatic dysfunction of cervical region: Secondary | ICD-10-CM | POA: Diagnosis not present

## 2018-03-09 DIAGNOSIS — M50322 Other cervical disc degeneration at C5-C6 level: Secondary | ICD-10-CM | POA: Diagnosis not present

## 2018-04-08 DIAGNOSIS — I10 Essential (primary) hypertension: Secondary | ICD-10-CM | POA: Diagnosis not present

## 2018-04-08 DIAGNOSIS — K219 Gastro-esophageal reflux disease without esophagitis: Secondary | ICD-10-CM | POA: Diagnosis not present

## 2018-04-08 DIAGNOSIS — Z780 Asymptomatic menopausal state: Secondary | ICD-10-CM | POA: Diagnosis not present

## 2018-04-08 DIAGNOSIS — E785 Hyperlipidemia, unspecified: Secondary | ICD-10-CM | POA: Diagnosis not present

## 2018-04-08 DIAGNOSIS — E559 Vitamin D deficiency, unspecified: Secondary | ICD-10-CM | POA: Diagnosis not present

## 2018-04-08 DIAGNOSIS — M8580 Other specified disorders of bone density and structure, unspecified site: Secondary | ICD-10-CM | POA: Diagnosis not present

## 2018-04-08 DIAGNOSIS — E039 Hypothyroidism, unspecified: Secondary | ICD-10-CM | POA: Diagnosis not present

## 2018-04-15 DIAGNOSIS — I6521 Occlusion and stenosis of right carotid artery: Secondary | ICD-10-CM | POA: Diagnosis not present

## 2018-04-15 DIAGNOSIS — E559 Vitamin D deficiency, unspecified: Secondary | ICD-10-CM | POA: Diagnosis not present

## 2018-04-15 DIAGNOSIS — K644 Residual hemorrhoidal skin tags: Secondary | ICD-10-CM | POA: Diagnosis not present

## 2018-04-15 DIAGNOSIS — Z780 Asymptomatic menopausal state: Secondary | ICD-10-CM | POA: Diagnosis not present

## 2018-04-15 DIAGNOSIS — K279 Peptic ulcer, site unspecified, unspecified as acute or chronic, without hemorrhage or perforation: Secondary | ICD-10-CM | POA: Diagnosis not present

## 2018-04-15 DIAGNOSIS — E039 Hypothyroidism, unspecified: Secondary | ICD-10-CM | POA: Diagnosis not present

## 2018-04-15 DIAGNOSIS — J040 Acute laryngitis: Secondary | ICD-10-CM | POA: Diagnosis not present

## 2018-04-15 DIAGNOSIS — I6523 Occlusion and stenosis of bilateral carotid arteries: Secondary | ICD-10-CM | POA: Diagnosis not present

## 2018-04-15 DIAGNOSIS — J45909 Unspecified asthma, uncomplicated: Secondary | ICD-10-CM | POA: Diagnosis not present

## 2018-04-15 DIAGNOSIS — I10 Essential (primary) hypertension: Secondary | ICD-10-CM | POA: Diagnosis not present

## 2018-04-15 DIAGNOSIS — K219 Gastro-esophageal reflux disease without esophagitis: Secondary | ICD-10-CM | POA: Diagnosis not present

## 2018-04-15 DIAGNOSIS — E785 Hyperlipidemia, unspecified: Secondary | ICD-10-CM | POA: Diagnosis not present

## 2018-04-15 DIAGNOSIS — I7300 Raynaud's syndrome without gangrene: Secondary | ICD-10-CM | POA: Diagnosis not present

## 2018-05-18 ENCOUNTER — Ambulatory Visit (INDEPENDENT_AMBULATORY_CARE_PROVIDER_SITE_OTHER): Payer: Medicare Other | Admitting: Internal Medicine

## 2018-05-18 ENCOUNTER — Encounter: Payer: Self-pay | Admitting: Internal Medicine

## 2018-05-18 ENCOUNTER — Ambulatory Visit: Payer: Medicare Other | Admitting: Internal Medicine

## 2018-05-18 VITALS — BP 124/60 | HR 72 | Ht 62.0 in | Wt 144.4 lb

## 2018-05-18 DIAGNOSIS — J4530 Mild persistent asthma, uncomplicated: Secondary | ICD-10-CM | POA: Diagnosis not present

## 2018-05-18 DIAGNOSIS — Z23 Encounter for immunization: Secondary | ICD-10-CM | POA: Diagnosis not present

## 2018-05-18 LAB — NITRIC OXIDE: NITRIC OXIDE: 17

## 2018-05-18 NOTE — Progress Notes (Signed)
HPI  IOV 08/11/2014  Chief Complaint  Patient presents with  . Pulmonary Consult    Pt referred by Dr. Maudie Mercury for asthma.    77 year old female referred by Dr. Maudie Mercury for asthma. Due to history of asthma for 30 years. She is a new [employer] resident. Asthma symptoms are made worse by not taking her inhalers and also by stress. She used to be followed by a Dr. Gerarda Gunther in [facility]. Asthma is associated with multiple medication allergies but no allergy to aspirin. She reports a diagnosis of asthma made 30 years ago. Since then has been on several inhalers but for the last few to several years has been on Pulmicort Turbuhaler twice daily. Symptoms only mild intermittent with this regimen he did she uses albuterol roughly every 6 weeks. In times of stress of bad weather she could use it a little bit more but never more than 2 times a week. No nocturnal symptoms. Last prednisone use for an exacerbation was several years ago. Currently she is here due to establishing care with a pulmonologist and reports stable health. Review of vaccine history so shows that she likely had Pneumovax many years ago in [place]. She's not had her Prevnar yet. She is up-to-date with her flu shot for this season.  Of note, she has chronic postnasal drainage that is mild and associated chronic clearing of the throat/she also gives a history of stage I colon cancer many years ago status post resection and no chemotherapy  I do not see any x-ray in our system are done by Dr. Maudie Mercury and review of his notes  Spirometry today is normal     11/12/2015 9 month follow up OV for asthma. ACQ Score = 1: ( Wheezing hardly any of the time the past week) She is doing well, no flares. Well maintained on current regimen. She is compliant with her Pulmicort daily, and is using her [employer] as rescue.She is up to date of all vaccinations. She denies wheezing,but does notice with the weather changes she has some faint  rare wheezing. She denies chest pain, orthopnea, hemoptysis, leg or calf pain.   OV 05/18/2017  Chief Complaint  Patient presents with  . Follow-up    Pt states that her asthma is under control and she is happy. Pt has seldom had to use her proventil inhaler. Denies any SOB or CP. States that she has had a sinus cough with nasal drainage mainly in the morning.    Follow-up mild persistent asthma  I personally have not seen her in a long time. Last seen by my nurse partition of May 2017. Since then she's been compliant with her daily Pulmicort if she says if she does not take her Pulmicort in a few days she will get chest tightness and symptoms. Otherwise asthma control questionnaire as of now is 0.2 showing excellent control. The middle of the night she does not wake up from asthma. When she wakes up she does not have any symptoms she's not limited in her activities because of asthma. She has not used her rescue inhaler she has not wheezed. The main problem appears to be some mild left nasal blockage intermittently. She recollects having had sinus surgery/nasal surgery many decades ago in [place]. She is asking for refills. She is up-to-date with her flu shot.  FenO 05/18/2017\ - 22 ppb     OV 05/18/2018  Subjective:  Patient ID: Jasmine Velez, female , DOB: [birth year] ,  age 28 y.o. , MRN: [id number] , ADDRESS: [place] [id number]   05/18/2018 -   Chief Complaint  Patient presents with  . Follow-up    1 year rov asthma- pt states she is doing well, needs refills today on resp meds.      HPI Jasmine Velez 77 y.o. -1 year follow-up for mild persistent asthma maintained on inhaled steroids.  Overall she is doing well.  In June 2019 her boyfriend/significant other suffered a stroke so she has been driving to [place] and she is a little bit stressed.  For the last several weeks she has had slight increase in respiratory symptoms but not to the point that she feels she  needs antibiotic or prednisone.  Currently asthma control questionnaire is 0.4.  Is not waking up in the middle of the night with asthma symptoms but when she wakes up she has very mild symptoms but her activities are not limited but she does experience some shortness of breath very little and she is not wheezing at all.  She used albuterol a few days ago.  She is up-to-date with the flu shot.  She needs a Pneumovax.  She is never had a chest x-ray with Jasmine Velez.  Immunization record also shows she could benefit from the new shingles vaccine.  At this point in time she wants to hold off on any Z-Pak.  She wants refills on her inhalers.  She continues with nasal steroids.  Exam nitric oxide at 17 ppb and normal     ROS    - per HPI     has a past medical history of Allergy, Arthritis, Asthma, Cancer ([place]), Cataract, GERD (gastroesophageal reflux disease), Hyperlipidemia, PUD (peptic ulcer disease), Sciatica, and UTI (lower urinary tract infection).   reports that she has never smoked. She has never used smokeless tobacco.  Past Surgical History:  Procedure Laterality Date  . CATARACT EXTRACTION  2013, 2014  . COLON RESECTION  2008  . COLONOSCOPY    . LASIK  2000  . NASAL SEPTUM SURGERY  1980's  . PILONIDAL CYST EXCISION  1960  . TONSILLECTOMY  1960  . TOTAL HIP ARTHROPLASTY  1994    Allergies  Allergen Reactions  . Other Other (See Comments)    Green, yellow, orange bell peppers causes severe gastric distress   . Ceclor [Cefaclor] Nausea Only    Gerd  . Nexium [Esomeprazole Magnesium] Nausea Only    sick  . Tramadol Nausea And Vomiting  . Erythromycin Rash    In mouth  . Penicillins Rash    In her moth    Immunization History  Administered Date(s) Administered  . Influenza Split 04/13/2014  . Influenza, High Dose Seasonal PF 05/11/2017, 05/14/2018  . Influenza,inj,Quad PF,6+ Mos 04/14/2015  . Pneumococcal Conjugate-13 08/11/2014    Family History  Problem Relation Age of  Onset  . CAD Father   . Dementia Mother   . Dementia Sister   . Colon polyps Neg Hx   . Colon cancer Neg Hx   . Rectal cancer Neg Hx   . Stomach cancer Neg Hx      Current Outpatient Medications:  .  albuterol (PROAIR HFA) 108 (90 Base) MCG/ACT inhaler, Inhale 1-2 puffs into the lungs every 6 (six) hours as needed for wheezing or shortness of breath., Disp: 1 Inhaler, Rfl: 5 .  budesonide (PULMICORT) 180 MCG/ACT inhaler, Inhale 1 puff 2 (two) times daily into the lungs., Disp: 1 Inhaler, Rfl: 3 .  fluticasone (FLONASE) 50 MCG/ACT nasal spray, Place 2 sprays daily into both nostrils., Disp: 16 g, Rfl: 3 .  hydrochlorothiazide (HYDRODIURIL) 25 MG tablet, Take 25 mg by mouth daily as needed., Disp: , Rfl:  .  Multiple Vitamin (MULTIVITAMIN WITH MINERALS) TABS tablet, Take 1 tablet by mouth daily., Disp: , Rfl:  .  Multiple Vitamins-Minerals (OCUVITE ADULT FORMULA PO), Take 1 capsule by mouth daily., Disp: , Rfl:  .  pravastatin (PRAVACHOL) 20 MG tablet, Take 40 mg by mouth once a week. , Disp: , Rfl: 12 .  RABEprazole (ACIPHEX) 20 MG tablet, Take 20 mg by mouth as needed. , Disp: , Rfl:   Current Facility-Administered Medications:  .  0.9 %  sodium chloride infusion, 500 mL, Intravenous, Once, Danis, Estill Cotta III, MD .  0.9 %  sodium chloride infusion, 500 mL, Intravenous, Once, Danis, Estill Cotta III, MD      Objective:   Vitals:   05/18/18 0945  BP: 124/60  Pulse: 72  SpO2: 98%  Weight: 144 lb 6.4 oz (65.5 kg)  Height: 5\' 2"  (1.575 m)    Estimated body mass index is 26.41 kg/m as calculated from the following:   Height as of this encounter: 5\' 2"  (1.575 m).   Weight as of this encounter: 144 lb 6.4 oz (65.5 kg).  @WEIGHTCHANGE @  [employer]   05/18/18 0945  Weight: 144 lb 6.4 oz (65.5 kg)     Physical Exam  General Appearance:    Alert, cooperative, no distress, appears stated age - yes , Deconditioned looking - no , OBESE  - no, Sitting on Wheelchair -  no  Head:     Normocephalic, without obvious abnormality, atraumatic  Eyes:    PERRL, conjunctiva/corneas clear,  Ears:    Normal TM's and external ear canals, both ears  Nose:   Nares normal, septum midline, mucosa normal, no drainage    or sinus tenderness. OXYGEN ON  - no . Patient is @ ra   Throat:   Lips, mucosa, and tongue normal; teeth and gums normal. Cyanosis on lips - no  Neck:   Supple, symmetrical, trachea midline, no adenopathy;    thyroid:  no enlargement/tenderness/nodules; no carotid   bruit or JVD  Back:     Symmetric, no curvature, ROM normal, no CVA tenderness  Lungs:     Distress - no , Wheeze no, Barrell Chest - non, Purse lip breathing - o, Crackles - no   Chest Wall:    No tenderness or deformity.    Heart:    Regular rate and rhythm, S1 and S2 normal, no rub   or gallop, Murmur - no  Breast Exam:    NOT DONE  Abdomen:     Soft, non-tender, bowel sounds active all four quadrants,    no masses, no organomegaly. Visceral obesity - no  Genitalia:   NOT DONE  Rectal:   NOT DONE  Extremities:   Extremities - normal, Has Cane - no, Clubbing - no, Edema - no  Pulses:   2+ and symmetric all extremities  Skin:   Stigmata of Connective Tissue Disease - no  Lymph nodes:   Cervical, supraclavicular, and axillary nodes normal  Psychiatric:  Neurologic:   Pleasant - yes, Anxious - no, Flat affect - no  CAm-ICU - neg, Alert and Oriented x 3 - yes, Moves all 4s - yes, Speech - normal, Cognition - intact           Assessment:  ICD-10-CM   1. Mild persistent asthma without complication H43.27 Nitric oxide    DG Chest 2 View       Plan:     Patient Instructions     ICD-10-CM   1. Mild persistent asthma without complication M14.70 Nitric oxide    Stable asthma well controlled Some minimal level of symptoms ongoing suggest you cannot come off pulmicort  Plan Pneumovax 05/18/2018 CXR 2 view 05/18/2018 Continue pulmicort 2 puff twice daily as before - 12 refills Albuterol  as needed - 12 refills Continue nasal steroid Please talk to PCP Jani Gravel, MD -  and ensure you get  shingrix ([place]) inactivated vaccine against shingles  If any flare up please call Jasmine Velez - also please specifically mention zpak your preferred antibiotic  Followup 12 months or sooner      SIGNATURE    Dr. Brand Males, M.D., F.C.C.P,  Pulmonary and Critical Care Medicine Staff Physician, [place] Director - Interstitial Lung Disease  Program  P[facility] [place] [facility] [place], [place], [address]  Pager: [phone], If no answer or between  15:00h - 7:00h: call [phone] Telephone: [phone]  10:09 AM 05/18/2018

## 2018-05-18 NOTE — Addendum Note (Signed)
Addended by: Shirlee More R on: 05/18/2018 10:56 AM   Modules accepted: Orders

## 2018-05-18 NOTE — Patient Instructions (Addendum)
ICD-10-CM   1. Mild persistent asthma without complication P79.43 Nitric oxide    Stable asthma well controlled Some minimal level of symptoms ongoing suggest you cannot come off pulmicort  Plan Pneumovax 05/18/2018 CXR 2 view 05/18/2018 Continue pulmicort 2 puff twice daily as before - 12 refills Albuterol as needed - 12 refills Continue nasal steroid Please talk to PCP Jani Gravel, MD -  and ensure you get  shingrix ([place]) inactivated vaccine against shingles  If any flare up please call us - also please specifically mention zpak your preferred antibiotic  Followup 12 months or sooner

## 2018-05-25 ENCOUNTER — Other Ambulatory Visit: Payer: Self-pay | Admitting: Internal Medicine

## 2018-05-25 DIAGNOSIS — E039 Hypothyroidism, unspecified: Secondary | ICD-10-CM | POA: Diagnosis not present

## 2018-05-25 DIAGNOSIS — J0100 Acute maxillary sinusitis, unspecified: Secondary | ICD-10-CM | POA: Diagnosis not present

## 2018-05-25 DIAGNOSIS — I10 Essential (primary) hypertension: Secondary | ICD-10-CM | POA: Diagnosis not present

## 2018-05-30 ENCOUNTER — Ambulatory Visit (HOSPITAL_COMMUNITY)
Admission: EM | Admit: 2018-05-30 | Discharge: 2018-05-30 | Disposition: A | Discharge status: Home / Self Care | Payer: Medicare Other | Attending: Family Medicine | Admitting: Family Medicine

## 2018-05-30 ENCOUNTER — Other Ambulatory Visit: Payer: Self-pay

## 2018-05-30 ENCOUNTER — Ambulatory Visit (INDEPENDENT_AMBULATORY_CARE_PROVIDER_SITE_OTHER): Payer: Medicare Other

## 2018-05-30 ENCOUNTER — Encounter (HOSPITAL_COMMUNITY): Payer: Self-pay | Admitting: Emergency Medicine

## 2018-05-30 DIAGNOSIS — J4541 Moderate persistent asthma with (acute) exacerbation: Secondary | ICD-10-CM

## 2018-05-30 DIAGNOSIS — R05 Cough: Secondary | ICD-10-CM | POA: Diagnosis not present

## 2018-05-30 MED ORDER — IPRATROPIUM-ALBUTEROL 0.5-2.5 (3) MG/3ML IN SOLN
3.0000 mL | Freq: Once | RESPIRATORY_TRACT | Status: AC
  Administered 2018-05-30: 3 mL via RESPIRATORY_TRACT

## 2018-05-30 MED ORDER — BENZONATATE 100 MG PO CAPS: 100.0000 mg | ORAL_CAPSULE | Freq: Three times a day (TID) | ORAL | 0 refills | Status: DC

## 2018-05-30 MED ORDER — PREDNISONE 20 MG PO TABS: 40.0000 mg | ORAL_TABLET | Freq: Every day | ORAL | 0 refills | Status: AC

## 2018-05-30 MED ORDER — ALBUTEROL SULFATE HFA 108 (90 BASE) MCG/ACT IN AERS: 1.0000 | INHALATION_SPRAY | Freq: Four times a day (QID) | RESPIRATORY_TRACT | 5 refills | Status: DC | PRN

## 2018-05-30 MED ORDER — IPRATROPIUM-ALBUTEROL 0.5-2.5 (3) MG/3ML IN SOLN
RESPIRATORY_TRACT | Status: AC
  Filled 2018-05-30: qty 3

## 2018-05-30 NOTE — ED Provider Notes (Addendum)
[place]    CSN: [id number] Arrival date & time: 05/30/18  1233     History   Chief Complaint Chief Complaint  Patient presents with  . URI    HPI Jasmine Velez is a 77 y.o. female history of asthma, GERD, hyperlipidemia, colon cancer, presenting today for evaluation of cough and chest tightness.  Patient states that she has had URI symptoms for the past week.  She was treated with azithromycin for sinus infection by her PCP.  She took the last dose of her Z-Pak yesterday.  She is continued to have a productive cough.  She woke up this morning with upper chest pressure and tightness.  She tried using her Pulmicort as prescribed, albuterol as well as steam inhalation.  Symptoms have not improved.  She has felt off and not 100%.  Is also been using Flonase for nasal congestion.  She notes that she had 2 pneumonia vaccines this year.  Denies fevers.  Denies nausea or vomiting.  HPI  Past Medical History:  Diagnosis Date  . Allergy    sinus issues with weather changes   . 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 Asthma   . Cancer [facility])    colon cancer   . Cataract    removed both eyes   . GERD (gastroesophageal reflux disease)   . Hyperlipidemia   . PUD (peptic ulcer disease)    25 yrs ago   . Sciatica   . UTI (lower urinary tract infection)     Patient Active Problem List   Diagnosis Date Noted  . Mild persistent asthma 08/11/2014  . Chronic throat clearing 08/11/2014  . History of asthma 08/11/2014    Past Surgical History:  Procedure Laterality Date  . CATARACT EXTRACTION  2013, 2014  . COLON RESECTION  2008  . COLONOSCOPY    . LASIK  2000  . NASAL SEPTUM SURGERY  1980's  . PILONIDAL CYST EXCISION  1960  . TONSILLECTOMY  1960  . TOTAL HIP ARTHROPLASTY  1994    OB History   None      Home Medications    Prior to Admission medications   Medication Sig Start Date End Date Taking? Authorizing Provider  budesonide (PULMICORT) 180 MCG/ACT  inhaler Inhale 1 puff 2 (two) times daily into the lungs. 05/18/17  Yes Brand Males, MD  fluticasone (FLONASE) 50 MCG/ACT nasal spray Place 2 sprays daily into both nostrils. 05/18/17  Yes Brand Males, MD  Multiple Vitamin (MULTIVITAMIN WITH MINERALS) TABS tablet Take 1 tablet by mouth daily.   Yes [provider]  Multiple Vitamins-Minerals (OCUVITE ADULT FORMULA PO) Take 1 capsule by mouth daily.   Yes [provider]  pravastatin (PRAVACHOL) 20 MG tablet Take 40 mg by mouth once a week.  06/11/17  Yes [provider]  RABEprazole (ACIPHEX) 20 MG tablet Take 20 mg by mouth as needed.    Yes [provider]  albuterol (PROAIR HFA) 108 (90 Base) MCG/ACT inhaler Inhale 1-2 puffs into the lungs every 6 (six) hours as needed for wheezing or shortness of breath. 05/30/18   Wieters, Hallie C, PA-C  benzonatate (TESSALON) 100 MG capsule Take 1 capsule (100 mg total) by mouth every 8 (eight) hours. 05/30/18   Wieters, Hallie C, PA-C  hydrochlorothiazide (HYDRODIURIL) 25 MG tablet Take 25 mg by mouth daily as needed.    [provider]  predniSONE (DELTASONE) 20 MG tablet Take 2 tablets (40 mg total)  by mouth daily for 5 days. Take with breakfast 05/30/18 06/04/18  Wieters, Elesa Hacker, PA-C    Family History Family History  Problem Relation Age of Onset  . CAD Father   . Dementia Mother   . Dementia Sister   . Colon polyps Neg Hx   . Colon cancer Neg Hx   . Rectal cancer Neg Hx   . Stomach cancer Neg Hx     Social History Social History   Tobacco Use  . Smoking status: Never Smoker  . Smokeless tobacco: Never Used  Substance Use Topics  . Alcohol use: Yes    Alcohol/week: 0.0 standard drinks    Comment: occassional  . Drug use: No     Allergies   Other; Ceclor [cefaclor]; Nexium [esomeprazole magnesium]; Tramadol; Erythromycin; and Penicillins   Review of Systems Review of Systems  Constitutional: Negative for activity change,  appetite change, chills, fatigue and fever.  HENT: Positive for congestion, rhinorrhea and sore throat. Negative for ear pain, sinus pressure and trouble swallowing.   Eyes: Negative for discharge and redness.  Respiratory: Positive for cough and chest tightness. Negative for shortness of breath.   Cardiovascular: Negative for chest pain.  Gastrointestinal: Negative for abdominal pain, diarrhea, nausea and vomiting.  Musculoskeletal: Negative for myalgias.  Skin: Negative for rash.  Neurological: Negative for dizziness, light-headedness and headaches.     Physical Exam Triage Vital Signs ED Triage Vitals  Enc Vitals Group     BP 05/30/18 1248 (!) 146/43     Pulse Rate 05/30/18 1248 69     Resp 05/30/18 1248 18     Temp 05/30/18 1248 (!) 97.5 F (36.4 C)     Temp Source 05/30/18 1248 Oral     SpO2 05/30/18 1248 100 %     Weight --      Height --      Head Circumference --      [facility] Flow --      Pain Score 05/30/18 1245 2     Pain Loc --      Pain Edu? --      Excl. in [place]? --    No data found.  Updated Vital Signs BP (!) 146/43 (BP Location: Right Arm)   Pulse 69   Temp (!) 97.5 F (36.4 C) (Oral)   Resp 18   SpO2 100%  Blood pressure rechecked prior to discharge, 101/48 Visual Acuity Right Eye Distance:   Left Eye Distance:   Bilateral Distance:    Right Eye Near:   Left Eye Near:    Bilateral Near:     Physical Exam  Constitutional: She appears well-developed and well-nourished. No distress.  No acute distress, nontoxic-appearing  HENT:  Head: Normocephalic and atraumatic.  Bilateral ears without tenderness to palpation of external auricle, tragus and mastoid, EAC's without erythema or swelling, TM's with good bony landmarks and cone of light. Non erythematous.  Oral mucosa pink and moist, no tonsillar enlargement or exudate. Posterior pharynx patent and nonerythematous-cobblestoning present, no uvula deviation or swelling. Normal phonation.  Eyes:  Conjunctivae are normal.  Neck: Neck supple.  Cardiovascular: Normal rate and regular rhythm.  No murmur heard. Pulmonary/Chest: Effort normal and breath sounds normal. No respiratory distress.  Breathing comfortably at rest, CTABL, mild inspiratory and expiratory wheezing throughout bilateral lung fields  Wheezing improved after breathing treatment  Abdominal: Soft. There is no tenderness.  Musculoskeletal: She exhibits no edema.  Neurological: She is alert.  Skin: Skin is warm and  dry.  Psychiatric: She has a normal mood and affect.  Nursing note and vitals reviewed.    UC Treatments / Results  Labs (all labs ordered are listed, but only abnormal results are displayed) Labs Reviewed - No data to display  EKG None  Radiology Dg Chest 2 View  Result Date: 05/30/2018 CLINICAL DATA:  Cough for 1 week EXAM: CHEST - 2 VIEW COMPARISON:  None. FINDINGS: Cardiac shadow is within normal limits. The lungs are mildly hyperinflated with mild chronic appearing interstitial change. No focal infiltrate or effusion is seen. Degenerative changes of the thoracic spine are noted. IMPRESSION: COPD without acute abnormality. Electronically Signed   By: Inez Catalina M.D.   On: 05/30/2018 13:48    Procedures Procedures (including critical care time)  Medications Ordered in UC Medications  ipratropium-albuterol (DUONEB) 0.5-2.5 (3) MG/3ML nebulizer solution 3 mL (3 mLs Nebulization Given 05/30/18 1308)    Initial Impression / Assessment and Plan / UC Course  I have reviewed the triage vital signs and the nursing notes.  Pertinent labs & imaging results that were available during my care of the patient were reviewed by me and considered in my medical decision making (see chart for details).     Chest x-ray negative for pneumonia.  Will treat for asthma exacerbation.  Continue inhalers as prescribed.  Prednisone x5 days.  Tessalon as needed for cough.Discussed strict return precautions. Patient  verbalized understanding and is agreeable with plan.  Final Clinical Impressions(s) / UC Diagnoses   Final diagnoses:  Moderate persistent asthma with acute exacerbation     Discharge Instructions     No pneumonia on chest x-ray Please continue to use Pulmicort and albuterol inhalers as prescribed Please start prednisone daily for the next 5 days to help with cough and inflammation and chest May use Tessalon as needed for cough  Please follow-up if chest discomfort worsening, not improving, developing shortness of breath, difficulty breathing, fevers     ED Prescriptions    Medication Sig Dispense Auth. Provider   albuterol (PROAIR HFA) 108 (90 Base) MCG/ACT inhaler Inhale 1-2 puffs into the lungs every 6 (six) hours as needed for wheezing or shortness of breath. 1 Inhaler Wieters, Hallie C, PA-C   predniSONE (DELTASONE) 20 MG tablet Take 2 tablets (40 mg total) by mouth daily for 5 days. Take with breakfast 10 tablet Wieters, Hallie C, PA-C   benzonatate (TESSALON) 100 MG capsule Take 1 capsule (100 mg total) by mouth every 8 (eight) hours. 21 capsule Wieters, Hallie C, PA-C     Controlled Substance Prescriptions [place] Controlled Substance Registry consulted? Not Applicable   Janith Lima, PA-C 05/30/18 1353    Wieters, [place] C, PA-C 05/30/18 1425

## 2018-05-30 NOTE — ED Triage Notes (Signed)
Cold symptoms for a week.  Patient was prescribed a z-pack by pcp.  Last pill of z-pac taken yesterday.  Today woke with upper chest tightness.  Patient is an asthmatic.  Took Pulmicort, and albuterol.  Initially, slightly inhaler helped. Tried steam inhalation, and chest seemed worse.  Chest "feels clogged"

## 2018-05-30 NOTE — Discharge Instructions (Addendum)
No pneumonia on chest x-ray Please continue to use Pulmicort and albuterol inhalers as prescribed Please start prednisone daily for the next 5 days to help with cough and inflammation and chest May use Tessalon as needed for cough  Please follow-up if chest discomfort worsening, not improving, developing shortness of breath, difficulty breathing, fevers

## 2018-07-19 DIAGNOSIS — M5136 Other intervertebral disc degeneration, lumbar region: Secondary | ICD-10-CM | POA: Diagnosis not present

## 2018-07-19 DIAGNOSIS — M9905 Segmental and somatic dysfunction of pelvic region: Secondary | ICD-10-CM | POA: Diagnosis not present

## 2018-07-19 DIAGNOSIS — M9903 Segmental and somatic dysfunction of lumbar region: Secondary | ICD-10-CM | POA: Diagnosis not present

## 2018-07-19 DIAGNOSIS — M9904 Segmental and somatic dysfunction of sacral region: Secondary | ICD-10-CM | POA: Diagnosis not present

## 2018-07-21 ENCOUNTER — Telehealth: Payer: Self-pay | Admitting: Internal Medicine

## 2018-07-21 DIAGNOSIS — M9905 Segmental and somatic dysfunction of pelvic region: Secondary | ICD-10-CM | POA: Diagnosis not present

## 2018-07-21 DIAGNOSIS — M9903 Segmental and somatic dysfunction of lumbar region: Secondary | ICD-10-CM | POA: Diagnosis not present

## 2018-07-21 DIAGNOSIS — M9904 Segmental and somatic dysfunction of sacral region: Secondary | ICD-10-CM | POA: Diagnosis not present

## 2018-07-21 DIAGNOSIS — M5136 Other intervertebral disc degeneration, lumbar region: Secondary | ICD-10-CM | POA: Diagnosis not present

## 2018-07-21 MED ORDER — FLUTICASONE PROPIONATE 50 MCG/ACT NA SUSP: 2.0000 | Freq: Every day | NASAL | 3 refills | Status: DC

## 2018-07-21 NOTE — Telephone Encounter (Signed)
Patient requesting refill for fluticasone to be sent to [facility]. Prescription sent.  Nothing further at this time.

## 2018-07-26 DIAGNOSIS — M5136 Other intervertebral disc degeneration, lumbar region: Secondary | ICD-10-CM | POA: Diagnosis not present

## 2018-07-26 DIAGNOSIS — M9903 Segmental and somatic dysfunction of lumbar region: Secondary | ICD-10-CM | POA: Diagnosis not present

## 2018-07-26 DIAGNOSIS — M9905 Segmental and somatic dysfunction of pelvic region: Secondary | ICD-10-CM | POA: Diagnosis not present

## 2018-07-26 DIAGNOSIS — M9904 Segmental and somatic dysfunction of sacral region: Secondary | ICD-10-CM | POA: Diagnosis not present

## 2018-10-08 DIAGNOSIS — I10 Essential (primary) hypertension: Secondary | ICD-10-CM | POA: Diagnosis not present

## 2018-10-08 DIAGNOSIS — E039 Hypothyroidism, unspecified: Secondary | ICD-10-CM | POA: Diagnosis not present

## 2018-10-15 DIAGNOSIS — K219 Gastro-esophageal reflux disease without esophagitis: Secondary | ICD-10-CM | POA: Diagnosis not present

## 2018-10-15 DIAGNOSIS — R350 Frequency of micturition: Secondary | ICD-10-CM | POA: Diagnosis not present

## 2018-10-15 DIAGNOSIS — E785 Hyperlipidemia, unspecified: Secondary | ICD-10-CM | POA: Diagnosis not present

## 2018-10-15 DIAGNOSIS — Z0000 Encounter for general adult medical examination without abnormal findings: Secondary | ICD-10-CM | POA: Diagnosis not present

## 2018-10-15 DIAGNOSIS — I10 Essential (primary) hypertension: Secondary | ICD-10-CM | POA: Diagnosis not present

## 2018-10-15 DIAGNOSIS — E039 Hypothyroidism, unspecified: Secondary | ICD-10-CM | POA: Diagnosis not present

## 2018-10-15 DIAGNOSIS — E559 Vitamin D deficiency, unspecified: Secondary | ICD-10-CM | POA: Diagnosis not present

## 2018-10-28 DIAGNOSIS — M9905 Segmental and somatic dysfunction of pelvic region: Secondary | ICD-10-CM | POA: Diagnosis not present

## 2018-10-28 DIAGNOSIS — M9903 Segmental and somatic dysfunction of lumbar region: Secondary | ICD-10-CM | POA: Diagnosis not present

## 2018-10-28 DIAGNOSIS — M9904 Segmental and somatic dysfunction of sacral region: Secondary | ICD-10-CM | POA: Diagnosis not present

## 2018-10-28 DIAGNOSIS — M5136 Other intervertebral disc degeneration, lumbar region: Secondary | ICD-10-CM | POA: Diagnosis not present

## 2018-11-09 DIAGNOSIS — M67441 Ganglion, right hand: Secondary | ICD-10-CM | POA: Diagnosis not present

## 2018-11-26 DIAGNOSIS — E039 Hypothyroidism, unspecified: Secondary | ICD-10-CM | POA: Diagnosis not present

## 2018-12-01 DIAGNOSIS — E039 Hypothyroidism, unspecified: Secondary | ICD-10-CM | POA: Diagnosis not present

## 2018-12-21 DIAGNOSIS — Z961 Presence of intraocular lens: Secondary | ICD-10-CM | POA: Diagnosis not present

## 2018-12-21 DIAGNOSIS — H353132 Nonexudative age-related macular degeneration, bilateral, intermediate dry stage: Secondary | ICD-10-CM | POA: Diagnosis not present

## 2018-12-21 DIAGNOSIS — H26493 Other secondary cataract, bilateral: Secondary | ICD-10-CM | POA: Diagnosis not present

## 2018-12-21 DIAGNOSIS — Z9889 Other specified postprocedural states: Secondary | ICD-10-CM | POA: Diagnosis not present

## 2018-12-21 DIAGNOSIS — H02834 Dermatochalasis of left upper eyelid: Secondary | ICD-10-CM | POA: Diagnosis not present

## 2018-12-21 DIAGNOSIS — H02831 Dermatochalasis of right upper eyelid: Secondary | ICD-10-CM | POA: Diagnosis not present

## 2018-12-21 DIAGNOSIS — H43811 Vitreous degeneration, right eye: Secondary | ICD-10-CM | POA: Diagnosis not present

## 2018-12-21 DIAGNOSIS — H04123 Dry eye syndrome of bilateral lacrimal glands: Secondary | ICD-10-CM | POA: Diagnosis not present

## 2019-01-21 DIAGNOSIS — E039 Hypothyroidism, unspecified: Secondary | ICD-10-CM | POA: Diagnosis not present

## 2019-03-04 DIAGNOSIS — R202 Paresthesia of skin: Secondary | ICD-10-CM | POA: Diagnosis not present

## 2019-03-04 DIAGNOSIS — E785 Hyperlipidemia, unspecified: Secondary | ICD-10-CM | POA: Diagnosis not present

## 2019-03-04 DIAGNOSIS — E039 Hypothyroidism, unspecified: Secondary | ICD-10-CM | POA: Diagnosis not present

## 2019-03-09 ENCOUNTER — Other Ambulatory Visit: Payer: Self-pay | Admitting: Internal Medicine

## 2019-03-09 DIAGNOSIS — R200 Anesthesia of skin: Secondary | ICD-10-CM

## 2019-03-17 ENCOUNTER — Other Ambulatory Visit: Payer: Self-pay

## 2019-03-17 ENCOUNTER — Ambulatory Visit
Admission: RE | Admit: 2019-03-17 | Discharge: 2019-03-17 | Disposition: A | Discharge status: Home / Self Care | Payer: Medicare Other | Source: Ambulatory Visit | Attending: Internal Medicine | Admitting: Internal Medicine

## 2019-03-17 DIAGNOSIS — R200 Anesthesia of skin: Secondary | ICD-10-CM

## 2019-03-17 DIAGNOSIS — R202 Paresthesia of skin: Secondary | ICD-10-CM

## 2019-04-05 ENCOUNTER — Other Ambulatory Visit: Payer: Medicare Other

## 2019-04-15 DIAGNOSIS — E559 Vitamin D deficiency, unspecified: Secondary | ICD-10-CM | POA: Diagnosis not present

## 2019-04-15 DIAGNOSIS — R350 Frequency of micturition: Secondary | ICD-10-CM | POA: Diagnosis not present

## 2019-04-15 DIAGNOSIS — R202 Paresthesia of skin: Secondary | ICD-10-CM | POA: Diagnosis not present

## 2019-04-15 DIAGNOSIS — E039 Hypothyroidism, unspecified: Secondary | ICD-10-CM | POA: Diagnosis not present

## 2019-04-15 DIAGNOSIS — E785 Hyperlipidemia, unspecified: Secondary | ICD-10-CM | POA: Diagnosis not present

## 2019-05-03 ENCOUNTER — Ambulatory Visit
Admission: RE | Admit: 2019-05-03 | Discharge: 2019-05-03 | Disposition: A | Discharge status: Home / Self Care | Payer: Medicare Other | Source: Ambulatory Visit | Attending: Internal Medicine | Admitting: Internal Medicine

## 2019-05-03 ENCOUNTER — Other Ambulatory Visit: Payer: Self-pay | Admitting: Internal Medicine

## 2019-05-03 ENCOUNTER — Other Ambulatory Visit: Payer: Self-pay

## 2019-05-03 DIAGNOSIS — I10 Essential (primary) hypertension: Secondary | ICD-10-CM | POA: Diagnosis not present

## 2019-05-03 DIAGNOSIS — J45909 Unspecified asthma, uncomplicated: Secondary | ICD-10-CM | POA: Diagnosis not present

## 2019-05-03 DIAGNOSIS — E039 Hypothyroidism, unspecified: Secondary | ICD-10-CM | POA: Diagnosis not present

## 2019-05-03 DIAGNOSIS — Z1231 Encounter for screening mammogram for malignant neoplasm of breast: Secondary | ICD-10-CM | POA: Diagnosis not present

## 2019-05-03 DIAGNOSIS — I7300 Raynaud's syndrome without gangrene: Secondary | ICD-10-CM | POA: Diagnosis not present

## 2019-05-03 DIAGNOSIS — R0989 Other specified symptoms and signs involving the circulatory and respiratory systems: Secondary | ICD-10-CM | POA: Diagnosis not present

## 2019-05-03 DIAGNOSIS — Z0000 Encounter for general adult medical examination without abnormal findings: Secondary | ICD-10-CM | POA: Diagnosis not present

## 2019-05-03 DIAGNOSIS — E785 Hyperlipidemia, unspecified: Secondary | ICD-10-CM | POA: Diagnosis not present

## 2019-05-03 DIAGNOSIS — E559 Vitamin D deficiency, unspecified: Secondary | ICD-10-CM | POA: Diagnosis not present

## 2019-05-03 DIAGNOSIS — K279 Peptic ulcer, site unspecified, unspecified as acute or chronic, without hemorrhage or perforation: Secondary | ICD-10-CM | POA: Diagnosis not present

## 2019-05-03 DIAGNOSIS — K219 Gastro-esophageal reflux disease without esophagitis: Secondary | ICD-10-CM | POA: Diagnosis not present

## 2019-05-03 DIAGNOSIS — Z780 Asymptomatic menopausal state: Secondary | ICD-10-CM | POA: Diagnosis not present

## 2019-05-16 ENCOUNTER — Encounter (HOSPITAL_COMMUNITY): Payer: Self-pay

## 2019-05-16 ENCOUNTER — Other Ambulatory Visit: Payer: Self-pay

## 2019-05-16 ENCOUNTER — Ambulatory Visit (HOSPITAL_COMMUNITY)
Admission: EM | Admit: 2019-05-16 | Discharge: 2019-05-16 | Disposition: A | Discharge status: Home / Self Care | Payer: Medicare Other | Attending: Family Medicine | Admitting: Family Medicine

## 2019-05-16 DIAGNOSIS — R070 Pain in throat: Secondary | ICD-10-CM

## 2019-05-16 DIAGNOSIS — R195 Other fecal abnormalities: Secondary | ICD-10-CM

## 2019-05-16 NOTE — Discharge Instructions (Addendum)
Continue to eat well. Exercise and get enough sleep Call your doctor tomorrow

## 2019-05-16 NOTE — ED Provider Notes (Signed)
[place]    CSN: [id number] Arrival date & time: 05/16/19  1705      History   Chief Complaint Chief Complaint  Patient presents with  . Throat problem    HPI Jasmine Velez is a 78 y.o. female.   HPI Very pleasant 78 year old woman who lives alone.  She is under the care of Dr. Maudie Mercury.  She states that she has asthma, hypertension, and high cholesterol.  Occasionally has GERD.  She states that she has been doing very well. She states that she is here because she is having an unusual sensation in her "throat".  She points to the front of her throat just to the right of her thyroid.  She states that it feels like a quivering sensation that comes and lasts only 1 to 2 seconds and then goes away.  No change in appetite.  No loss of ability to swallow.  No sore throat.  No accident injury or change in activity.  Is been present since yesterday.  She is very nervous because she is lost 2 husbands to stroke and she is concerned about stroke.  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  She is due for a right carotid Doppler this week.  No headache, dizziness, difficulty with speech, difficulty with strength balance, cognition, or or ambulation. She also mentions that since yesterday her stool is chalky, light in color.  No new medicines.  No new supplements.  No new foods.  No history of liver disease.  She had blood work with her annual physical in October.  She has had a colon resection due to cancer. Past Medical History:  Diagnosis Date  . Allergy    sinus issues with weather changes   . 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 Asthma   . Cancer [facility])    colon cancer   . Cataract    removed both eyes   . GERD (gastroesophageal reflux disease)   . Hyperlipidemia   . PUD (peptic ulcer disease)    25 yrs ago   . Sciatica   . UTI (lower urinary tract infection)     Patient Active Problem List   Diagnosis Date Noted  . Mild persistent asthma 08/11/2014  .  Chronic throat clearing 08/11/2014  . History of asthma 08/11/2014    Past Surgical History:  Procedure Laterality Date  . CATARACT EXTRACTION  2013, 2014  . COLON RESECTION  2008  . COLONOSCOPY    . LASIK  2000  . NASAL SEPTUM SURGERY  1980's  . PILONIDAL CYST EXCISION  1960  . TONSILLECTOMY  1960  . TOTAL HIP ARTHROPLASTY  1994    OB History   No obstetric history on file.      Home Medications    Prior to Admission medications   Medication Sig Start Date End Date Taking? Authorizing Provider  albuterol (PROAIR HFA) 108 (90 Base) MCG/ACT inhaler Inhale 1-2 puffs into the lungs every 6 (six) hours as needed for wheezing or shortness of breath. 05/30/18   Wieters, Hallie C, PA-C  benzonatate (TESSALON) 100 MG capsule Take 1 capsule (100 mg total) by mouth every 8 (eight) hours. 05/30/18   Wieters, Hallie C, PA-C  budesonide (PULMICORT) 180 MCG/ACT inhaler Inhale 1 puff 2 (two) times daily into the lungs. 05/18/17   Brand Males, MD  fluticasone (FLONASE) 50 MCG/ACT nasal spray Place 2 sprays into both nostrils daily. 07/21/18   Ramaswamy,  Murali, MD  hydrochlorothiazide (HYDRODIURIL) 25 MG tablet Take 25 mg by mouth daily as needed.    [provider]  Multiple Vitamin (MULTIVITAMIN WITH MINERALS) TABS tablet Take 1 tablet by mouth daily.    [provider]  Multiple Vitamins-Minerals (OCUVITE ADULT FORMULA PO) Take 1 capsule by mouth daily.    [provider]  pravastatin (PRAVACHOL) 20 MG tablet Take 40 mg by mouth once a week.  06/11/17   [provider]  RABEprazole (ACIPHEX) 20 MG tablet Take 20 mg by mouth as needed.     [provider]    Family History Family History  Problem Relation Age of Onset  . CAD Father   . Dementia Mother   . Dementia Sister   . Colon polyps Neg Hx   . Colon cancer Neg Hx   . Rectal cancer Neg Hx   . Stomach cancer Neg Hx     Social History Social History   Tobacco Use  . Smoking  status: Never Smoker  . Smokeless tobacco: Never Used  Substance Use Topics  . Alcohol use: Yes    Alcohol/week: 0.0 standard drinks    Comment: occassional  . Drug use: No     Allergies   Other, Ceclor [cefaclor], Nexium [esomeprazole magnesium], Tramadol, Erythromycin, and Penicillins   Review of Systems Review of Systems  Constitutional: Negative for chills and fever.  HENT: Negative for ear pain and sore throat.        Funny sensation in neck  Eyes: Negative for pain and visual disturbance.  Respiratory: Negative for cough and shortness of breath.   Cardiovascular: Negative for chest pain and palpitations.  Gastrointestinal: Negative for abdominal pain, constipation and vomiting.       Change in stool color  Genitourinary: Negative for dysuria and hematuria.  Musculoskeletal: Negative for arthralgias and back pain.  Skin: Negative for color change and rash.  Neurological: Negative for seizures and syncope.  All other systems reviewed and are negative.    Physical Exam Triage Vital Signs ED Triage Vitals  Enc Vitals Group     BP 05/16/19 1734 (!) 169/55     Pulse Rate 05/16/19 1734 76     Resp 05/16/19 1734 16     Temp 05/16/19 1734 98.1 F (36.7 C)     Temp Source 05/16/19 1734 Oral     SpO2 05/16/19 1734 100 %   No data found.  Updated Vital Signs BP (!) 169/55 (BP Location: Right Arm)   Pulse 76   Temp 98.1 F (36.7 C) (Oral)   Resp 16   SpO2 100%       Physical Exam Constitutional:      General: She is not in acute distress.    Appearance: She is well-developed and normal weight.  HENT:     Head: Normocephalic and atraumatic.     Right Ear: Tympanic membrane and ear canal normal.     Left Ear: Tympanic membrane and ear canal normal.     Nose: Nose normal.     Mouth/Throat:     Mouth: Mucous membranes are dry.  Eyes:     Conjunctiva/sclera: Conjunctivae normal.     Pupils: Pupils are equal, round, and reactive to light.  Neck:      Musculoskeletal: Normal range of motion.     Vascular: Carotid bruit present.     Comments: Right bruit Cardiovascular:     Rate and Rhythm: Normal rate and regular rhythm.  Heart sounds: Normal heart sounds.  Pulmonary:     Effort: Pulmonary effort is normal. No respiratory distress.     Breath sounds: Normal breath sounds.  Abdominal:     General: There is no distension.     Palpations: Abdomen is soft.  Musculoskeletal: Normal range of motion.  Skin:    General: Skin is warm and dry.  Neurological:     General: No focal deficit present.     Mental Status: She is alert.     Cranial Nerves: No cranial nerve deficit.     Sensory: No sensory deficit.     Motor: No weakness.     Coordination: Coordination normal.     Gait: Gait normal.  Psychiatric:        Mood and Affect: Mood normal.        Behavior: Behavior normal.      UC Treatments / Results  Labs (all labs ordered are listed, but only abnormal results are displayed) Labs Reviewed - No data to display  EKG   Radiology No results found.  Procedures Procedures (including critical care time)  Medications Ordered in [place].   Medications - No data to display  Initial Impression / Assessment and Plan / UC Course  I have reviewed the triage vital signs and the nursing notes.  Pertinent labs & imaging results that were available during my care of the patient were reviewed by me and considered in my medical decision making (see chart for details).    Reassured.  Exam normal.  Unusual symptom.  NOT STROKE  Final Clinical Impressions(s) / UC Diagnoses   Final diagnoses:  Light stools  Throat discomfort     Discharge Instructions     Continue to eat well. Exercise and get enough sleep Call your doctor tomorrow    ED Prescriptions    None     PDMP not reviewed this encounter.   Raylene Everts, MD 05/16/19 [phone]

## 2019-05-16 NOTE — ED Triage Notes (Signed)
Patient presents to Urgent Care with complaints of tingling on the left side of her throat since last night. Patient reports she thought she was having a GERD flare up after eating some popcorn. Pt states today when she got up it did it again this morning and it has been happening regularly.  Pt states her stool has been a light grey or white over the past 24 hours.

## 2019-05-19 DIAGNOSIS — I6522 Occlusion and stenosis of left carotid artery: Secondary | ICD-10-CM | POA: Diagnosis not present

## 2019-05-19 DIAGNOSIS — R0989 Other specified symptoms and signs involving the circulatory and respiratory systems: Secondary | ICD-10-CM | POA: Diagnosis not present

## 2019-08-23 DIAGNOSIS — Z23 Encounter for immunization: Secondary | ICD-10-CM | POA: Diagnosis not present

## 2019-08-23 DIAGNOSIS — L309 Dermatitis, unspecified: Secondary | ICD-10-CM | POA: Diagnosis not present

## 2019-08-23 DIAGNOSIS — L304 Erythema intertrigo: Secondary | ICD-10-CM | POA: Diagnosis not present

## 2019-10-05 ENCOUNTER — Other Ambulatory Visit: Payer: Self-pay | Admitting: Internal Medicine

## 2019-10-10 ENCOUNTER — Other Ambulatory Visit: Payer: Self-pay | Admitting: Internal Medicine

## 2019-10-13 ENCOUNTER — Telehealth: Payer: Self-pay | Admitting: Internal Medicine

## 2019-10-13 MED ORDER — FLUTICASONE PROPIONATE 50 MCG/ACT NA SUSP: 2.0000 | Freq: Every day | NASAL | 1 refills | Status: DC

## 2019-10-13 NOTE — Telephone Encounter (Signed)
Pt has not been seen since 2019. But has a pending OV with MR in May. Rx has been sent in to last until this appointment. Pt is aware. Nothing further needed.

## 2019-10-19 DIAGNOSIS — M67441 Ganglion, right hand: Secondary | ICD-10-CM | POA: Diagnosis not present

## 2019-10-25 DIAGNOSIS — I10 Essential (primary) hypertension: Secondary | ICD-10-CM | POA: Diagnosis not present

## 2019-10-25 DIAGNOSIS — N390 Urinary tract infection, site not specified: Secondary | ICD-10-CM | POA: Diagnosis not present

## 2019-10-25 DIAGNOSIS — E039 Hypothyroidism, unspecified: Secondary | ICD-10-CM | POA: Diagnosis not present

## 2019-11-02 DIAGNOSIS — I6521 Occlusion and stenosis of right carotid artery: Secondary | ICD-10-CM | POA: Diagnosis not present

## 2019-11-02 DIAGNOSIS — E039 Hypothyroidism, unspecified: Secondary | ICD-10-CM | POA: Diagnosis not present

## 2019-11-02 DIAGNOSIS — E559 Vitamin D deficiency, unspecified: Secondary | ICD-10-CM | POA: Diagnosis not present

## 2019-11-02 DIAGNOSIS — K219 Gastro-esophageal reflux disease without esophagitis: Secondary | ICD-10-CM | POA: Diagnosis not present

## 2019-11-02 DIAGNOSIS — E785 Hyperlipidemia, unspecified: Secondary | ICD-10-CM | POA: Diagnosis not present

## 2019-11-16 ENCOUNTER — Ambulatory Visit (INDEPENDENT_AMBULATORY_CARE_PROVIDER_SITE_OTHER): Payer: Medicare Other | Admitting: Internal Medicine

## 2019-11-16 ENCOUNTER — Other Ambulatory Visit: Payer: Self-pay

## 2019-11-16 ENCOUNTER — Encounter: Payer: Self-pay | Admitting: Internal Medicine

## 2019-11-16 VITALS — BP 130/90 | HR 58 | Temp 97.3°F | Ht 62.0 in | Wt 144.0 lb

## 2019-11-16 DIAGNOSIS — J4530 Mild persistent asthma, uncomplicated: Secondary | ICD-10-CM | POA: Diagnosis not present

## 2019-11-16 NOTE — Patient Instructions (Signed)
ICD-10-CM   1. Mild persistent asthma without complication  [id number]     Asthma stable Wheeze yesterday was probably (agree) stress reaction about today being milestone remembrance day for you  Plan   -Continue Pulmicort 1 puff 2 times daily -Albuterol as needed -Continue monitoring of your carotid situatation  Follow-up = 6-9 months or sooner if needed -Return or call if asthma is getting out of control

## 2019-11-16 NOTE — Progress Notes (Signed)
HPI  IOV 08/11/2014  Chief Complaint  Patient presents with  . Pulmonary Consult    Pt referred by Dr. Maudie Mercury for asthma.    79 year old female referred by Dr. Maudie Mercury for asthma. Due to history of asthma for 30 years. She is a new [employer] resident. Asthma symptoms are made worse by not taking her inhalers and also by stress. She used to be followed by a Dr. Gerarda Gunther in [facility]. Asthma is associated with multiple medication allergies but no allergy to aspirin. She reports a diagnosis of asthma made 30 years ago. Since then has been on several inhalers but for the last few to several years has been on Pulmicort Turbuhaler twice daily. Symptoms only mild intermittent with this regimen he did she uses albuterol roughly every 6 weeks. In times of stress of bad weather she could use it a little bit more but never more than 2 times a week. No nocturnal symptoms. Last prednisone use for an exacerbation was several years ago. Currently she is here due to establishing care with a pulmonologist and reports stable health. Review of vaccine history so shows that she likely had Pneumovax many years ago in [place]. She's not had her Prevnar yet. She is up-to-date with her flu shot for this season.  Of note, she has chronic postnasal drainage that is mild and associated chronic clearing of the throat/she also gives a history of stage I colon cancer many years ago status post resection and no chemotherapy  I do not see any x-ray in our system are done by Dr. Maudie Mercury and review of his notes  Spirometry today is normal     11/12/2015 9 month follow up OV for asthma. ACQ Score = 1: ( Wheezing hardly any of the time the past week) She is doing well, no flares. Well maintained on current regimen. She is compliant with her Pulmicort daily, and is using her [employer] as rescue.She is up to date of all vaccinations. She denies wheezing,but does notice with the weather changes she has some faint  rare wheezing. She denies chest pain, orthopnea, hemoptysis, leg or calf pain.   OV 05/18/2017  Chief Complaint  Patient presents with  . Follow-up    Pt states that her asthma is under control and she is happy. Pt has seldom had to use her proventil inhaler. Denies any SOB or CP. States that she has had a sinus cough with nasal drainage mainly in the morning.    Follow-up mild persistent asthma  I personally have not seen her in a long time. Last seen by my nurse partition of May 2017. Since then she's been compliant with her daily Pulmicort if she says if she does not take her Pulmicort in a few days she will get chest tightness and symptoms. Otherwise asthma control questionnaire as of now is 0.2 showing excellent control. The middle of the night she does not wake up from asthma. When she wakes up she does not have any symptoms she's not limited in her activities because of asthma. She has not used her rescue inhaler she has not wheezed. The main problem appears to be some mild left nasal blockage intermittently. She recollects having had sinus surgery/nasal surgery many decades ago in [place]. She is asking for refills. She is up-to-date with her flu shot.  FenO 05/18/2017\ - 22 ppb     OV 05/18/2018  Subjective:  Patient ID: Lane Hacker, female , DOB: [birth year] ,  age 74 y.o. , MRN: [id number] , ADDRESS: [place] [id number]   05/18/2018 -   Chief Complaint  Patient presents with  . Follow-up    1 year rov asthma- pt states she is doing well, needs refills today on resp meds.      HPI VICKE DUNSMORE 79 y.o. -1 year follow-up for mild persistent asthma maintained on inhaled steroids.  Overall she is doing well.  In June 2019 her boyfriend/significant other suffered a stroke so she has been driving to [place] and she is a little bit stressed.  For the last several weeks she has had slight increase in respiratory symptoms but not to the point that she feels she  needs antibiotic or prednisone.  Currently asthma control questionnaire is 0.4.  Is not waking up in the middle of the night with asthma symptoms but when she wakes up she has very mild symptoms but her activities are not limited but she does experience some shortness of breath very little and she is not wheezing at all.  She used albuterol a few days ago.  She is up-to-date with the flu shot.  She needs a Pneumovax.  She is never had a chest x-ray with [place].  Immunization record also shows she could benefit from the new shingles vaccine.  At this point in time she wants to hold off on any Z-Pak.  She wants refills on her inhalers.  She continues with nasal steroids.  Exam nitric oxide at 17 ppb and normal     ROS    - per HPI    OV 11/16/2019  Subjective:  Patient ID: Lane Hacker, female , DOB: [birth year] , age 68 y.o. , MRN: [id number] , ADDRESS: [place] [id number]   11/16/2019 -   Chief Complaint  Patient presents with  . Follow-up    uses proair every once and a while.   Follow-up mild persistent asthma on Pulmicort  HPI KAHMIYAH FRANCE 79 y.o. -last seen in November 2019.  After that in February 2020 her significant other passed away.  Today is the 14th death anniversary of her husband and the 10th anniversary of meeting her late significant other.  She says she is coping with the grief of this.  However her grief is appropriate.  Yesterday because of the remembrance days coming up today she did have some wheezing but otherwise asthma is fully well under control.  Albuterol use is nonexistent.  No nocturnal awakening.   She is known to have carotid bruit and carotid stenosis.  2018 right carotid stenosis was between 50 and 69%.  She says she followed up with primary care office in October 2020 she had another carotid ultrasound and she has been reassured that the carotid stenosis less than 50%.  They are following this up.    IMPRESSION: COPD without acute  abnormality.   Electronically Signed   By: Inez Catalina M.D.   On: 05/30/2018 13:48  ROS - per HPI     has a past medical history of Allergy, Arthritis, Asthma, Cancer (Gas), Cataract, GERD (gastroesophageal reflux disease), Hyperlipidemia, PUD (peptic ulcer disease), Sciatica, and UTI (lower urinary tract infection).   reports that she has never smoked. She has never used smokeless tobacco.  Past Surgical History:  Procedure Laterality Date  . CATARACT EXTRACTION  2013, 2014  . COLON RESECTION  2008  . COLONOSCOPY    . LASIK  2000  . NASAL SEPTUM SURGERY  1980's  . PILONIDAL CYST EXCISION  1960  . TONSILLECTOMY  1960  . TOTAL HIP ARTHROPLASTY  1994    Allergies  Allergen Reactions  . Other Other (See Comments)    Green, yellow, orange bell peppers causes severe gastric distress   . Ceclor [Cefaclor] Nausea Only    Gerd  . Nexium [Esomeprazole Magnesium] Nausea Only    sick  . Statins     Muscle pain, nausea, reflux  . Tramadol Nausea And Vomiting  . Erythromycin Rash    In mouth  . Penicillins Rash    In her moth    Immunization History  Administered Date(s) Administered  . Influenza Split 04/13/2014  . Influenza, High Dose Seasonal PF 05/11/2017, 05/14/2018, 04/09/2019  . Influenza,inj,Quad PF,6+ Mos 04/14/2015  . PFIZER SARS-COV-2 Vaccination 08/04/2019, 08/25/2019  . Pneumococcal Conjugate-13 08/11/2014  . Pneumococcal Polysaccharide-23 05/18/2018    Family History  Problem Relation Age of Onset  . CAD Father   . Dementia Mother   . Dementia Sister   . Colon polyps Neg Hx   . Colon cancer Neg Hx   . Rectal cancer Neg Hx   . Stomach cancer Neg Hx      Current Outpatient Medications:  .  albuterol (PROAIR HFA) 108 (90 Base) MCG/ACT inhaler, Inhale 1-2 puffs into the lungs every 6 (six) hours as needed for wheezing or shortness of breath., Disp: 1 Inhaler, Rfl: 5 .  budesonide (PULMICORT) 180 MCG/ACT inhaler, Inhale 1 puff 2 (two) times daily  into the lungs., Disp: 1 Inhaler, Rfl: 3 .  fluticasone (FLONASE) 50 MCG/ACT nasal spray, Place 2 sprays into both nostrils daily., Disp: 16 g, Rfl: 1 .  hydrochlorothiazide (HYDRODIURIL) 25 MG tablet, Take 25 mg by mouth daily as needed., Disp: , Rfl:  .  Multiple Vitamin (MULTIVITAMIN WITH MINERALS) TABS tablet, Take 1 tablet by mouth daily., Disp: , Rfl:  .  Multiple Vitamins-Minerals (OCUVITE ADULT FORMULA PO), Take 1 capsule by mouth daily., Disp: , Rfl:  .  pravastatin (PRAVACHOL) 20 MG tablet, Take 40 mg by mouth once a week. , Disp: , Rfl: 12 .  RABEprazole (ACIPHEX) 20 MG tablet, Take 20 mg by mouth as needed. , Disp: , Rfl:  .  ARMOUR THYROID 30 MG tablet, Take 30 mg by mouth daily., Disp: , Rfl:  .  hydrocortisone 2.5 % cream, APPLY TO AFFECTED AREA ON BREAST TWICE A DAY, Disp: , Rfl:       Objective:   Vitals:   11/16/19 0942  BP: 130/90  Pulse: (!) 58  Temp: (!) 97.3 F (36.3 C)  TempSrc: Temporal  SpO2: 99%  Weight: 144 lb (65.3 kg)  Height: 5\' 2"  (1.575 m)    Estimated body mass index is 26.34 kg/m as calculated from the following:   Height as of this encounter: 5\' 2"  (1.575 m).   Weight as of this encounter: 144 lb (65.3 kg).  @WEIGHTCHANGE @  [employer]   11/16/19 0942  Weight: 144 lb (65.3 kg)     Physical Exam  Right carotid bruit.  Otherwise nonfocal exam normal oral cavity.  Clear to auscultation bilaterally.  Alert and oriented x3.  Speech is normal.  Abdomen soft.     Assessment:       ICD-10-CM   1. Mild persistent asthma without complication  A999333        Plan:     Patient Instructions     ICD-10-CM   1. Mild persistent asthma without complication  J45.30     Asthma stable Wheeze yesterday was probably (agree) stress reaction about today being milestone remembrance day for you  Plan   -Continue Pulmicort 1 puff 2 times daily -Albuterol as needed -Continue monitoring of your carotid situatation  Follow-up = 6-9 months or  sooner if needed -Return or call if asthma is getting out of control     SIGNATURE    Dr. Brand Males, M.D., F.C.C.P,  Pulmonary and Critical Care Medicine Staff Physician, [place] Director - Interstitial Lung Disease  Program  Pulmonary [facility] [place], [place], [address]  Pager: [phone], If no answer or between  15:00h - 7:00h: call [phone] Telephone: [phone]  10:12 AM 11/16/2019

## 2019-12-21 DIAGNOSIS — H43811 Vitreous degeneration, right eye: Secondary | ICD-10-CM | POA: Diagnosis not present

## 2019-12-21 DIAGNOSIS — H353132 Nonexudative age-related macular degeneration, bilateral, intermediate dry stage: Secondary | ICD-10-CM | POA: Diagnosis not present

## 2019-12-21 DIAGNOSIS — Z961 Presence of intraocular lens: Secondary | ICD-10-CM | POA: Diagnosis not present

## 2019-12-21 DIAGNOSIS — H02831 Dermatochalasis of right upper eyelid: Secondary | ICD-10-CM | POA: Diagnosis not present

## 2019-12-21 DIAGNOSIS — H26493 Other secondary cataract, bilateral: Secondary | ICD-10-CM | POA: Diagnosis not present

## 2019-12-21 DIAGNOSIS — Z9889 Other specified postprocedural states: Secondary | ICD-10-CM | POA: Diagnosis not present

## 2019-12-21 DIAGNOSIS — H04123 Dry eye syndrome of bilateral lacrimal glands: Secondary | ICD-10-CM | POA: Diagnosis not present

## 2019-12-21 DIAGNOSIS — H02834 Dermatochalasis of left upper eyelid: Secondary | ICD-10-CM | POA: Diagnosis not present

## 2019-12-28 ENCOUNTER — Other Ambulatory Visit: Payer: Self-pay

## 2019-12-28 ENCOUNTER — Encounter (INDEPENDENT_AMBULATORY_CARE_PROVIDER_SITE_OTHER): Payer: Medicare Other | Admitting: Ophthalmology

## 2019-12-28 DIAGNOSIS — H353132 Nonexudative age-related macular degeneration, bilateral, intermediate dry stage: Secondary | ICD-10-CM

## 2019-12-28 DIAGNOSIS — H43813 Vitreous degeneration, bilateral: Secondary | ICD-10-CM

## 2020-02-01 ENCOUNTER — Encounter: Payer: Self-pay | Admitting: Physician Assistant

## 2020-02-01 ENCOUNTER — Ambulatory Visit (INDEPENDENT_AMBULATORY_CARE_PROVIDER_SITE_OTHER): Payer: Medicare Other | Admitting: Physician Assistant

## 2020-02-01 VITALS — BP 110/60 | HR 72 | Ht 62.0 in | Wt 141.0 lb

## 2020-02-01 DIAGNOSIS — R140 Abdominal distension (gaseous): Secondary | ICD-10-CM | POA: Diagnosis not present

## 2020-02-01 DIAGNOSIS — R1010 Upper abdominal pain, unspecified: Secondary | ICD-10-CM

## 2020-02-01 NOTE — Progress Notes (Signed)
Chief Complaint: Bloating, diarrhea  HPI:    Jasmine Velez is an 79 year old Caucasian female with a past medical history as listed below including colon cancer, known to Dr. Loletha Carrow, who presents to clinic today for a complaint of bloating and diarrhea.    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  No repeat routine surveillance colonoscopy is recommended due to age.  EGD on the same day for reflux, with small hiatal hernia and otherwise normal.    Today, patient explains that about 6 weeks ago she had an episode where she woke up in the morning and felt that the upper part of her abdomen was bloated and very hard and had a lot of gas pain.  This stopped after about 2 to 3 days and she returned to normal, but last week it came back again just for 24 hours.  Patient tells me that both times it has always been there after she wakes up and then seems to resolve on its own.  She does have what seems like more frequent loose stools when this happens.  Denies nausea or vomiting.    Denies fever, chills, blood in her stool or weight loss.  Past Medical History:  Diagnosis Date  . Allergy    sinus issues with weather changes   . 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 Asthma   . Cancer [facility])    colon cancer   . Cataract    removed both eyes   . GERD (gastroesophageal reflux disease)   . Hyperlipidemia   . PUD (peptic ulcer disease)    25 yrs ago   . Sciatica   . UTI (lower urinary tract infection)     Past Surgical History:  Procedure Laterality Date  . CATARACT EXTRACTION  2013, 2014  . COLON RESECTION  2008  . COLONOSCOPY    . LASIK  2000  . NASAL SEPTUM SURGERY  1980's  . PILONIDAL CYST EXCISION  1960  . TONSILLECTOMY  1960  . TOTAL HIP ARTHROPLASTY  1994    Current Outpatient Medications  Medication Sig Dispense Refill  . albuterol (PROAIR HFA) 108 (90  Base) MCG/ACT inhaler Inhale 1-2 puffs into the lungs every 6 (six) hours as needed for wheezing or shortness of breath. 1 Inhaler 5  . ARMOUR THYROID 30 MG tablet Take 30 mg by mouth daily.    . budesonide (PULMICORT) 180 MCG/ACT inhaler Inhale 1 puff 2 (two) times daily into the lungs. 1 Inhaler 3  . fluticasone (FLONASE) 50 MCG/ACT nasal spray Place 2 sprays into both nostrils daily. 16 g 1  . hydrochlorothiazide (HYDRODIURIL) 25 MG tablet Take 25 mg by mouth daily as needed.    . hydrocortisone 2.5 % cream APPLY TO AFFECTED AREA ON BREAST TWICE A DAY    . Multiple Vitamin (MULTIVITAMIN WITH MINERALS) TABS tablet Take 1 tablet by mouth daily.    . Multiple Vitamins-Minerals (OCUVITE ADULT FORMULA PO) Take 1 capsule by mouth daily.    . pravastatin (PRAVACHOL) 20 MG tablet Take 40 mg by mouth once a week.   12  . RABEprazole (ACIPHEX) 20 MG tablet Take 20 mg by mouth as needed.      No current facility-administered medications for this visit.    Allergies as of 02/01/2020 - Review Complete 02/01/2020  Allergen Reaction Noted  . Other Other (See Comments) 08/06/2017  .  Ceclor [cefaclor] Nausea Only 07/25/2014  . Nexium [esomeprazole magnesium] Nausea Only 07/25/2014  . Statins  11/16/2019  . Tramadol Nausea And Vomiting 11/06/2015  . Erythromycin Rash 07/25/2014  . Penicillins Rash 07/25/2014    Family History  Problem Relation Age of Onset  . CAD Father   . Dementia Mother   . Dementia Sister   . Colon polyps Neg Hx   . Colon cancer Neg Hx   . Rectal cancer Neg Hx   . Stomach cancer Neg Hx     Social History   Socioeconomic History  . Marital status: Married    Spouse name: Not on file  . Number of children: Not on file  . Years of education: Not on file  . Highest education level: Not on file  Occupational History  . Occupation: retired  Tobacco Use  . Smoking status: Never Smoker  . Smokeless tobacco: Never Used  Vaping Use  . Vaping Use: Never used  Substance  and Sexual Activity  . Alcohol use: Yes    Alcohol/week: 0.0 standard drinks    Comment: occassional  . Drug use: No  . Sexual activity: Not on file  Other Topics Concern  . Not on file  Social History Narrative  . Not on file   Social Determinants of Health   Financial Resource Strain:   . Difficulty of Paying Living Expenses:   Food Insecurity:   . Worried About Charity fundraiser in the Last Year:   . Arboriculturist in the Last Year:   Transportation Needs:   . Film/video editor (Medical):   Marland Kitchen Lack of Transportation (Non-Medical):   Physical Activity:   . Days of Exercise per Week:   . Minutes of Exercise per Session:   Stress:   . Feeling of Stress :   Social Connections:   . Frequency of Communication with Friends and Family:   . Frequency of Social Gatherings with Friends and Family:   . Attends Religious Services:   . Active Member of Clubs or Organizations:   . Attends Archivist Meetings:   Marland Kitchen Marital Status:   Intimate Partner Violence:   . Fear of Current or Ex-Partner:   . Emotionally Abused:   Marland Kitchen Physically Abused:   . Sexually Abused:     Review of Systems:    Constitutional: No weight loss, fever or chills Cardiovascular: No chest pain   Respiratory: No SOB  Gastrointestinal: See HPI and otherwise negative   Physical Exam:  Vital signs: BP 110/60   Pulse 72   Ht 5\' 2"  (1.575 m)   Wt 141 lb (64 kg)   BMI 25.79 kg/m   Constitutional:   Pleasant Elderly Caucasian female appears to be in NAD, Well developed, Well nourished, alert and cooperative Respiratory: Respirations even and unlabored. Lungs clear to auscultation bilaterally.   No wheezes, crackles, or rhonchi.  Cardiovascular: Normal S1, S2. No MRG. Regular rate and rhythm. No peripheral edema, cyanosis or pallor.  Gastrointestinal:  Soft, nondistended, nontender. No rebound or guarding. Normal bowel sounds. No appreciable masses or hepatomegaly. Rectal:  Not performed.   Psychiatric: Demonstrates good judgement and reason without abnormal affect or behaviors.  No recent labs or imaging.  Assessment: 1.  Generalized abdominal pain: This is happened twice now and only last for a day or 2 at a time, last episode a week ago, history of bowel resection in the past; consider intermittent bowel obstruction versus IBS versus other 2.  Bloating/diarrhea: With above  Plan: 1.  Discussed with patient that there is a possibility that she is having partial bowel obstructions given her history of colon cancer and surgery in the past.  Would recommend that the next time she has an acute event like this she call our office.  At that time I would like to have an abdominal x-ray 2 view ordered for her so she can come in that day. 2.  Otherwise recommend the patient watch what she is eating and see if there is any pattern to what causes this to happen. 3.  Discussed that if the pain does not go away or she stops having bowel movements or becomes nauseous/vomits during these episodes then would recommend she go to the ER. 4.  Patient to follow in clinic as needed with [place] in the future.  Ellouise Newer, PA-C [facility] 02/01/2020, 9:02 AM  Cc: Jani Gravel, MD

## 2020-02-01 NOTE — Patient Instructions (Signed)
If you are age 81 or older, your body mass index should be between 23-30. Your Body mass index is 25.79 kg/m. If this is out of the aforementioned range listed, please consider follow up with your Primary Care Provider.  If you are age 52 or younger, your body mass index should be between 19-25. Your Body mass index is 25.79 kg/m. If this is out of the aformentioned range listed, please consider follow up with your Primary Care Provider.   Follow up as needed.

## 2020-02-03 NOTE — Progress Notes (Signed)
____________________________________________________________  Attending physician addendum:  Thank you for sending this case to me. I have reviewed the entire note, and the outlined plan seems appropriate.  That plan is fine, though seems unlikely to be partial bowel obstruction with diarrhea. Consider SIBO, but that typically presents with chronic rather than episodic symptoms as this has been.  Wilfrid Lund, MD  ____________________________________________________________

## 2020-04-20 DIAGNOSIS — Z23 Encounter for immunization: Secondary | ICD-10-CM | POA: Diagnosis not present

## 2020-05-29 ENCOUNTER — Other Ambulatory Visit: Payer: Self-pay | Admitting: Internal Medicine

## 2020-06-06 ENCOUNTER — Other Ambulatory Visit: Payer: Self-pay | Admitting: Internal Medicine

## 2020-06-06 DIAGNOSIS — Z1231 Encounter for screening mammogram for malignant neoplasm of breast: Secondary | ICD-10-CM

## 2020-06-12 DIAGNOSIS — I10 Essential (primary) hypertension: Secondary | ICD-10-CM | POA: Diagnosis not present

## 2020-06-12 DIAGNOSIS — E785 Hyperlipidemia, unspecified: Secondary | ICD-10-CM | POA: Diagnosis not present

## 2020-06-12 DIAGNOSIS — R7309 Other abnormal glucose: Secondary | ICD-10-CM | POA: Diagnosis not present

## 2020-06-12 DIAGNOSIS — E039 Hypothyroidism, unspecified: Secondary | ICD-10-CM | POA: Diagnosis not present

## 2020-06-19 DIAGNOSIS — E039 Hypothyroidism, unspecified: Secondary | ICD-10-CM | POA: Diagnosis not present

## 2020-06-19 DIAGNOSIS — E785 Hyperlipidemia, unspecified: Secondary | ICD-10-CM | POA: Diagnosis not present

## 2020-06-19 DIAGNOSIS — J45909 Unspecified asthma, uncomplicated: Secondary | ICD-10-CM | POA: Diagnosis not present

## 2020-06-19 DIAGNOSIS — K279 Peptic ulcer, site unspecified, unspecified as acute or chronic, without hemorrhage or perforation: Secondary | ICD-10-CM | POA: Diagnosis not present

## 2020-06-19 DIAGNOSIS — K219 Gastro-esophageal reflux disease without esophagitis: Secondary | ICD-10-CM | POA: Diagnosis not present

## 2020-06-19 DIAGNOSIS — Z0000 Encounter for general adult medical examination without abnormal findings: Secondary | ICD-10-CM | POA: Diagnosis not present

## 2020-06-19 DIAGNOSIS — Z85038 Personal history of other malignant neoplasm of large intestine: Secondary | ICD-10-CM | POA: Diagnosis not present

## 2020-06-19 DIAGNOSIS — L309 Dermatitis, unspecified: Secondary | ICD-10-CM | POA: Diagnosis not present

## 2020-06-25 DIAGNOSIS — L309 Dermatitis, unspecified: Secondary | ICD-10-CM | POA: Diagnosis not present

## 2020-07-02 DIAGNOSIS — E039 Hypothyroidism, unspecified: Secondary | ICD-10-CM | POA: Diagnosis not present

## 2020-07-02 NOTE — Progress Notes (Signed)
Patient ID: Jasmine Velez, female   DOB: 22-Nov-1940, 79 y.o.   MRN: [id number]            Referring Provider: Janie Morning  Reason for Appointment:  Hypothyroidism, new visit    History of Present Illness:   Hypothyroidism was first diagnosed in 09/2018  At the time of diagnosis patient was having routine lab work and TSH was 6.7 She did not have at that time any symptoms of fatigue, cold sensitivity or weight change Not clear if she had abnormal thyroid levels prior to that           The patient has been treated with both levothyroxine and Armour Thyroid When she first started supplementation in 3/20 she was tried on unknown dose of levothyroxine but she thinks it caused her to have more reflux symptoms and she was switched to Armour Thyroid 15 mg daily    With starting thyroid supplementation she did not feel any different  Subsequently her TSH was monitored periodically and ranged between 5.7 and 6.0 in 2020 In 10/2019 she was seen by a different healthcare professional and even though her TSH was only 4.1 she was told to go up to 30 mg of Armour Thyroid Did not again feel any different with the dosage change Subsequent TSH 2 months later in 12/2019 was 2.8  In late summer she thinks she was starting to have more fatigue overall and continued until this month However she did not complain of any palpitations, shakiness, unusual weight loss or heat intolerance  She had an episode of generalized rash on 06/14/2020 which was treated by dermatologist as allergic dermatitis She was told to stop her Armour Thyroid on 06/20/2020 when her TSH was 0.01  Since then her rash is improving and she has not had any fatigue TSH done on 12/20 was reportedly 3.6 She is currently not on any treatment         Patient's weight history is as follows:  Wt Readings from Last 3 Encounters:  07/03/20 141 lb 12.8 oz (64.3 kg)  02/01/20 141 lb (64 kg)  11/16/19 144 lb (65.3 kg)    Thyroid function  results have been as follows:  No results found for: TSH, FREET4, T3FREE   Past Medical History:  Diagnosis Date  . Allergy    sinus issues with weather changes   . 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 Asthma   . Cancer [facility])    colon cancer   . Cataract    removed both eyes   . GERD (gastroesophageal reflux disease)   . Hyperlipidemia   . PUD (peptic ulcer disease)    25 yrs ago   . Sciatica   . UTI (lower urinary tract infection)     Past Surgical History:  Procedure Laterality Date  . CATARACT EXTRACTION  2013, 2014  . COLON RESECTION  2008  . COLONOSCOPY    . LASIK  2000  . NASAL SEPTUM SURGERY  1980's  . PILONIDAL CYST EXCISION  1960  . TONSILLECTOMY  1960  . TOTAL HIP ARTHROPLASTY  1994    Family History  Problem Relation Age of Onset  . CAD Father   . Dementia Mother   . Dementia Sister   . Colon polyps Neg Hx   . Colon cancer Neg Hx   . Rectal cancer Neg Hx   . Stomach cancer Neg Hx   . Thyroid disease Neg Hx  Social History:  reports that she has never smoked. She has never used smokeless tobacco. She reports current alcohol use. She reports that she does not use drugs.  Allergies:  Allergies  Allergen Reactions  . Other Other (See Comments)    Green, yellow, orange bell peppers causes severe gastric distress   . Ceclor [Cefaclor] Nausea Only    Gerd  . Nexium [Esomeprazole Magnesium] Nausea Only    sick  . Statins     Muscle pain, nausea, reflux  . Tramadol Nausea And Vomiting  . Erythromycin Rash    In mouth  . Penicillins Rash    In her mouth    Allergies as of 07/03/2020      Reactions   Other Other (See Comments)   Green, yellow, orange bell peppers causes severe gastric distress    Ceclor [cefaclor] Nausea Only   Gerd   Nexium [esomeprazole Magnesium] Nausea Only   sick   Statins    Muscle pain, nausea, reflux   Tramadol Nausea And Vomiting   Erythromycin Rash   In mouth   Penicillins Rash   In her mouth       Medication List       Accurate as of July 03, 2020  2:40 PM. If you have any questions, ask your nurse or doctor.        STOP taking these medications   Armour Thyroid 30 MG tablet Generic drug: thyroid Stopped by: Elayne Snare, MD     TAKE these medications   albuterol 108 (90 Base) MCG/ACT inhaler Commonly known as: ProAir HFA Inhale 1-2 puffs into the lungs every 6 (six) hours as needed for wheezing or shortness of breath.   budesonide 180 MCG/ACT inhaler Commonly known as: PULMICORT Inhale 1 puff 2 (two) times daily into the lungs.   fluticasone 50 MCG/ACT nasal spray Commonly known as: FLONASE SPRAY 2 SPRAYS INTO EACH NOSTRIL EVERY DAY   hydrochlorothiazide 25 MG tablet Commonly known as: HYDRODIURIL Take 25 mg by mouth daily as needed.   hydrocortisone 2.5 % cream APPLY TO AFFECTED AREA ON BREAST TWICE A DAY   Lutein 20 MG Caps 1 capsule with a meal   LUTEIN PO One every other day   multivitamin with minerals Tabs tablet Take 1 tablet by mouth daily.   OCUVITE ADULT FORMULA PO Take 1 capsule by mouth daily.   pravastatin 20 MG tablet Commonly known as: PRAVACHOL Take 40 mg by mouth every other day.   RABEprazole 20 MG tablet Commonly known as: ACIPHEX Take 20 mg by mouth as needed.   VITAMIN D3 PO 2000IU daily          Review of Systems  Constitutional: Negative for weight loss.  HENT: Negative for trouble swallowing.   Respiratory: Negative for shortness of breath.   Cardiovascular: Negative for palpitations.  Gastrointestinal:       Had constipation earlier this month and followed by diarrhea for about 3 days, now normal with Imodium. Takes AcipHex as needed for reflux  Endocrine: Negative for fatigue.  Neurological: Negative for weakness and tremors.  Psychiatric/Behavioral: Negative for nervousness.   No history of hypertension: Blood pressure relatively higher today only She is apparently using HydroDIURIL and only when she has  edema of legs             Examination:    BP (!) 164/82   Pulse 69   Ht 5\' 2"  (1.575 m)   Wt 141 lb 12.8 oz (64.3 kg)  SpO2 95%   BMI 25.94 kg/m   GENERAL:  Average build.   No pallor.    Skin: Minimal reddish macular rash on left neck area laterally and upper arm  EYES:  No prominence of the eyes or swelling of the eyelids  ENT: Oral exam not indicated  NECK: No lymphadenopathy  THYROID:  Not palpable.  HEART:  Normal  S1 and S2; no murmur or click.  CHEST:    Lungs: Vescicular breath sounds heard equally.  No crepitations/ wheeze.  ABDOMEN:  No distention.  Liver and spleen not palpable.  No other mass or tenderness.  NEUROLOGICAL: Reflexes are slightly brisk at biceps. No tremor No tremor of hands  EXTREMITIES:  Normal peripheral joints.  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 At baseline she was asymptomatic and did not have change in her mild fatigue with thyroid supplementation Also no family history of hypothyroidism to indicate Hashimoto's disease No goiter on exam  Also may have had mild iatrogenic hyperthyroidism with the recent use of 30 mg of Armour Thyroid with symptoms of fatigue which resolved with stopping the Armour Thyroid  Discussed the nature of subclinical hypothyroidism with the patient and considering her age and minimal increase in TSH without symptoms she may or may not progress to overt symptomatic hypothyroidism in the future  Do not see any clear-cut indication for using thyroid supplementation and would favor observation alone  Recent history of generalized pruritic maculopapular rash: Does not appear to have had urticaria and unlikely to be related to over replacement with thyroid supplements and likely an allergic reaction Currently this is improving with steroid creams topically  Patient was asking about safety and necessity of getting Shingrix vaccine and reassured her that this  should be safe and any side effects will not be worse because of history of medication allergies   PLAN:   She will not resume her thyroid supplement She will follow-up in about 2 months to recheck her thyroid levels Discussed that we will consider thyroid supplementation only if she is having unusual persistent fatigue and TSH approaching 10 or more Patient understands the discussion and all questions were answered  Follow-up in about 2 months   Elayne Snare 07/03/2020, 2:40 PM   Consultation note copy sent to the PCP  Note: This office note was prepared with Dragon voice recognition system technology. Any transcriptional errors that result from this process are unintentional.

## 2020-07-03 ENCOUNTER — Encounter: Payer: Self-pay | Admitting: Endocrinology

## 2020-07-03 ENCOUNTER — Other Ambulatory Visit: Payer: Self-pay

## 2020-07-03 ENCOUNTER — Ambulatory Visit (INDEPENDENT_AMBULATORY_CARE_PROVIDER_SITE_OTHER): Payer: Medicare Other | Admitting: Endocrinology

## 2020-07-03 VITALS — BP 164/82 | HR 69 | Ht 62.0 in | Wt 141.8 lb

## 2020-07-03 DIAGNOSIS — E038 Other specified hypothyroidism: Secondary | ICD-10-CM | POA: Diagnosis not present

## 2020-07-03 DIAGNOSIS — L239 Allergic contact dermatitis, unspecified cause: Secondary | ICD-10-CM

## 2020-07-03 DIAGNOSIS — K219 Gastro-esophageal reflux disease without esophagitis: Secondary | ICD-10-CM

## 2020-07-23 ENCOUNTER — Ambulatory Visit: Payer: Medicare Other

## 2020-07-23 DIAGNOSIS — L719 Rosacea, unspecified: Secondary | ICD-10-CM | POA: Diagnosis not present

## 2020-07-23 DIAGNOSIS — L304 Erythema intertrigo: Secondary | ICD-10-CM | POA: Diagnosis not present

## 2020-07-23 DIAGNOSIS — L309 Dermatitis, unspecified: Secondary | ICD-10-CM | POA: Diagnosis not present

## 2020-08-24 ENCOUNTER — Other Ambulatory Visit: Payer: Self-pay

## 2020-08-24 ENCOUNTER — Ambulatory Visit
Admission: RE | Admit: 2020-08-24 | Discharge: 2020-08-24 | Disposition: A | Discharge status: Home / Self Care | Payer: Medicare Other | Source: Ambulatory Visit | Attending: Internal Medicine | Admitting: Internal Medicine

## 2020-08-24 DIAGNOSIS — Z1231 Encounter for screening mammogram for malignant neoplasm of breast: Secondary | ICD-10-CM | POA: Diagnosis not present

## 2020-09-07 ENCOUNTER — Other Ambulatory Visit: Payer: Self-pay

## 2020-09-07 ENCOUNTER — Other Ambulatory Visit (INDEPENDENT_AMBULATORY_CARE_PROVIDER_SITE_OTHER): Payer: Medicare Other

## 2020-09-07 DIAGNOSIS — E038 Other specified hypothyroidism: Secondary | ICD-10-CM

## 2020-09-07 LAB — T4, FREE: Free T4: 0.91 ng/dL (ref 0.60–1.60)

## 2020-09-07 LAB — TSH: TSH: 5.52 u[IU]/mL — ABNORMAL HIGH (ref 0.35–4.50)

## 2020-09-11 ENCOUNTER — Ambulatory Visit (INDEPENDENT_AMBULATORY_CARE_PROVIDER_SITE_OTHER): Payer: Medicare Other | Admitting: Endocrinology

## 2020-09-11 ENCOUNTER — Other Ambulatory Visit: Payer: Self-pay

## 2020-09-11 ENCOUNTER — Encounter: Payer: Self-pay | Admitting: Endocrinology

## 2020-09-11 VITALS — BP 140/86 | HR 74 | Ht 62.0 in | Wt 144.0 lb

## 2020-09-11 DIAGNOSIS — E038 Other specified hypothyroidism: Secondary | ICD-10-CM | POA: Insufficient documentation

## 2020-09-11 NOTE — Progress Notes (Signed)
Patient ID: Jasmine Velez, female   DOB: 01/06/1941, 80 y.o.   MRN: [id number]            Referring Provider: Janie Morning  Reason for Appointment:  Hypothyroidism, new visit    History of Present Illness:   Hypothyroidism was first diagnosed in 09/2018  At the time of diagnosis patient was having routine lab work and TSH was 6.7 She did not have at that time any symptoms of fatigue, cold sensitivity or weight change Not clear if she had abnormal thyroid levels prior to that           The patient has been treated with both levothyroxine and Armour Thyroid When she first started supplementation in 3/20 she was tried on unknown dose of levothyroxine but she thinks it caused her to have more reflux symptoms and she was switched to Armour Thyroid 15 mg daily    With starting thyroid supplementation she did not feel any different  Subsequently her TSH was monitored periodically and ranged between 5.7 and 6.0 in 2020 In 10/2019 she was seen by a different healthcare professional and even though her TSH was only 4.1 she was told to go up to 30 mg of Armour Thyroid Did not again feel any different with the dosage change Subsequent TSH 2 months later in 12/2019 was 2.8  In late summer 2021 she thinks she was starting to have more fatigue overall and continued until this month However she did not complain of any palpitations, shakiness, unusual weight loss or heat intolerance  She had an episode of generalized rash on 06/14/2020 which was treated by dermatologist as allergic dermatitis She was told to stop her Armour Thyroid on 06/20/2020 when her TSH was 0.01 TSH done on 07/02/20 was reportedly 3.6  RECENT history:  She is currently not on any thyroid supplements since 12/20//21 Since her initial consultation in 06/2020 she was told not to take any thyroid supplements since she was felt to have subclinical hypothyroidism  She is feeling overall fairly good and no symptoms of fatigue,  lethargy without her Armour Thyroid She has had near complete resolution of her skin rash from last year  Her TSH is 5.5         Patient's weight history is as follows:  Wt Readings from Last 3 Encounters:  09/11/20 144 lb (65.3 kg)  07/03/20 141 lb 12.8 oz (64.3 kg)  02/01/20 141 lb (64 kg)    Thyroid function results have been as follows:  Lab Results  Component Value Date   TSH 5.52 (H) 09/07/2020   FREET4 0.91 09/07/2020     Past Medical History:  Diagnosis Date  . Allergy    sinus issues with weather changes   . 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 Asthma   . Cancer [facility])    colon cancer   . Cataract    removed both eyes   . GERD (gastroesophageal reflux disease)   . Hyperlipidemia   . PUD (peptic ulcer disease)    25 yrs ago   . Sciatica   . UTI (lower urinary tract infection)     Past Surgical History:  Procedure Laterality Date  . CATARACT EXTRACTION  2013, 2014  . COLON RESECTION  2008  . COLONOSCOPY    . LASIK  2000  . NASAL SEPTUM SURGERY  1980's  . PILONIDAL CYST EXCISION  1960  . TONSILLECTOMY  1960  . [place]  Family History  Problem Relation Age of Onset  . CAD Father   . Dementia Mother   . Dementia Sister   . Colon polyps Neg Hx   . Colon cancer Neg Hx   . Rectal cancer Neg Hx   . Stomach cancer Neg Hx   . Thyroid disease Neg Hx     Social History:  reports that she has never smoked. She has never used smokeless tobacco. She reports current alcohol use. She reports that she does not use drugs.  Allergies:  Allergies  Allergen Reactions  . Other Other (See Comments)    Green, yellow, orange bell peppers causes severe gastric distress   . Ceclor [Cefaclor] Nausea Only    Gerd  . Nexium [Esomeprazole Magnesium] Nausea Only    sick  . Statins     Muscle pain, nausea, reflux  . Tramadol Nausea And Vomiting  . Erythromycin Rash    In mouth  . Penicillins Rash    In her mouth    Allergies as of  09/11/2020      Reactions   Other Other (See Comments)   Green, yellow, orange bell peppers causes severe gastric distress    Ceclor [cefaclor] Nausea Only   Gerd   Nexium [esomeprazole Magnesium] Nausea Only   sick   Statins    Muscle pain, nausea, reflux   Tramadol Nausea And Vomiting   Erythromycin Rash   In mouth   Penicillins Rash   In her mouth      Medication List       Accurate as of September 11, 2020  1:40 PM. If you have any questions, ask your nurse or doctor.        albuterol 108 (90 Base) MCG/ACT inhaler Commonly known as: ProAir HFA Inhale 1-2 puffs into the lungs every 6 (six) hours as needed for wheezing or shortness of breath.   budesonide 180 MCG/ACT inhaler Commonly known as: PULMICORT Inhale 1 puff 2 (two) times daily into the lungs.   fluticasone 50 MCG/ACT nasal spray Commonly known as: FLONASE SPRAY 2 SPRAYS INTO EACH NOSTRIL EVERY DAY   hydrochlorothiazide 25 MG tablet Commonly known as: HYDRODIURIL Take 25 mg by mouth daily as needed.   hydrocortisone 2.5 % cream APPLY TO AFFECTED AREA ON BREAST TWICE A DAY   Lutein 20 MG Caps 1 capsule with a meal   LUTEIN PO One every other day   multivitamin with minerals Tabs tablet Take 1 tablet by mouth daily.   pravastatin 20 MG tablet Commonly known as: PRAVACHOL Take 40 mg by mouth every other day.   RABEprazole 20 MG tablet Commonly known as: ACIPHEX Take 20 mg by mouth as needed.   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 At baseline she was asymptomatic and did not have change in her mild fatigue with thyroid supplementation No goiter on exam  She is still feeling fairly good without  any thyroid supplements and her TSH is 5.5   PLAN:   She will  not need any thyroid supplements at this time  She will follow-up in about 4 months to recheck her thyroid levels  Discussed that we will consider thyroid supplementation only if she is having unusual persistent fatigue along with TSH around 10 or more Patient understands the explanation     Elayne Snare 09/11/2020, 1:40 PM    Note: This office note was prepared with Dragon voice recognition system technology. Any transcriptional errors that result from this process are unintentional.

## 2020-09-12 ENCOUNTER — Ambulatory Visit (INDEPENDENT_AMBULATORY_CARE_PROVIDER_SITE_OTHER): Payer: Medicare Other | Admitting: Physician Assistant

## 2020-09-12 ENCOUNTER — Encounter: Payer: Self-pay | Admitting: Physician Assistant

## 2020-09-12 VITALS — BP 144/80 | HR 61 | Ht 62.0 in | Wt 142.0 lb

## 2020-09-12 DIAGNOSIS — K219 Gastro-esophageal reflux disease without esophagitis: Secondary | ICD-10-CM

## 2020-09-12 DIAGNOSIS — K625 Hemorrhage of anus and rectum: Secondary | ICD-10-CM

## 2020-09-12 DIAGNOSIS — K5900 Constipation, unspecified: Secondary | ICD-10-CM | POA: Diagnosis not present

## 2020-09-12 NOTE — Progress Notes (Signed)
Chief Complaint: Bloody stools  HPI:    Jasmine Velez is an 80 year old Caucasian female with a past medical history of colon cancer, reflux and PUD, known to Dr. Loletha Carrow, who presents to clinic today with a complaint of bloody stools.    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  No repeat routine surveillance recommended due to age.  EGD on the same day for reflux with small hiatal hernia and otherwise normal.    02/01/2020 patient seen in clinic and described pain in the upper part of her abdomen and bloating.  Discussed possibility of partial bowel obstructions given her history of colon cancer and surgery in the past.    Today, the patient presents clinic and tells me that on February 12 she started seeing a small amount of bright red blood just at "the end of my stools", she felt like her stool was "snagging on something" when it would come out.  Tells me that she was constipated during the first few days of this passing a very hard stool and having to strain.  This stopped on the sixth day and had slowed down prior to that.  She has seen no blood since.  No rectal pain.  No change in bowel habits.    Also complains of some reflux which is worsened over the past month or so, tells me this happens 3 to 4 days out of the week now.  She is using Gas-X as needed but also AcipHex 20 mg as needed.  Tells me she has been having to use AcipHex more often lately.  Associated symptoms include a small amount of epigastric discomfort.    Denies fever, chills, weight loss or symptoms that awaken her from sleep.  Past Medical History:  Diagnosis Date  . Allergy    sinus issues with weather changes   . 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 Asthma   . Cancer [facility])    colon cancer   . Cataract    removed both eyes   . GERD (gastroesophageal reflux disease)   . Hyperlipidemia   .  PUD (peptic ulcer disease)    25 yrs ago   . Sciatica   . UTI (lower urinary tract infection)     Past Surgical History:  Procedure Laterality Date  . CATARACT EXTRACTION  2013, 2014  . COLON RESECTION  2008  . COLONOSCOPY    . LASIK  2000  . NASAL SEPTUM SURGERY  1980's  . PILONIDAL CYST EXCISION  1960  . TONSILLECTOMY  1960  . TOTAL HIP ARTHROPLASTY  1994    Current Outpatient Medications  Medication Sig Dispense Refill  . albuterol (PROAIR HFA) 108 (90 Base) MCG/ACT inhaler Inhale 1-2 puffs into the lungs every 6 (six) hours as needed for wheezing or shortness of breath. 1 Inhaler 5  . budesonide (PULMICORT) 180 MCG/ACT inhaler Inhale 1 puff 2 (two) times daily into the lungs. 1 Inhaler 3  . Cholecalciferol (VITAMIN D3 PO) 2000IU daily    . fluticasone (FLONASE) 50 MCG/ACT nasal spray SPRAY 2 SPRAYS INTO EACH NOSTRIL EVERY DAY 16 mL 3  . hydrochlorothiazide (HYDRODIURIL) 25 MG tablet Take 25 mg by mouth daily as needed.    . hydrocortisone 2.5 % cream APPLY TO AFFECTED AREA ON BREAST TWICE A DAY    . Lutein 20 MG CAPS 1 capsule with a meal    .  LUTEIN PO One every other day    . Multiple Vitamin (MULTIVITAMIN WITH MINERALS) TABS tablet Take 1 tablet by mouth daily.    . pravastatin (PRAVACHOL) 20 MG tablet Take 40 mg by mouth every other day.   12  . RABEprazole (ACIPHEX) 20 MG tablet Take 20 mg by mouth as needed.      No current facility-administered medications for this visit.    Allergies as of 09/12/2020 - Review Complete 09/11/2020  Allergen Reaction Noted  . Other Other (See Comments) 08/06/2017  . Ceclor [cefaclor] Nausea Only 07/25/2014  . Nexium [esomeprazole magnesium] Nausea Only 07/25/2014  . Statins  11/16/2019  . Tramadol Nausea And Vomiting 11/06/2015  . Erythromycin Rash 07/25/2014  . Penicillins Rash 07/25/2014    Family History  Problem Relation Age of Onset  . CAD Father   . Dementia Mother   . Dementia Sister   . Colon polyps Neg Hx   .  Colon cancer Neg Hx   . Rectal cancer Neg Hx   . Stomach cancer Neg Hx   . Thyroid disease Neg Hx     Social History   Socioeconomic History  . Marital status: Widowed    Spouse name: Not on file  . Number of children: 1  . Years of education: Not on file  . Highest education level: Not on file  Occupational History  . Occupation: retired  Tobacco Use  . Smoking status: Never Smoker  . Smokeless tobacco: Never Used  Vaping Use  . Vaping Use: Never used  Substance and Sexual Activity  . Alcohol use: Yes    Alcohol/week: 0.0 standard drinks    Comment: occassional wine  . Drug use: No  . Sexual activity: Not on file  Other Topics Concern  . Not on file  Social History Narrative   Has a boyfriend, "sexually active" at 40 years old   Social Determinants of Radio broadcast assistant Strain: Not on file  Food Insecurity: Not on file  Transportation Needs: Not on file  Physical Activity: Not on file  Stress: Not on file  Social Connections: Not on file  Intimate Partner Violence: Not on file    Review of Systems:    Constitutional: No weight loss, fever or chills Cardiovascular: No chest pain Respiratory: No SOB Gastrointestinal: See HPI and otherwise negative   Physical Exam:  Vital signs: BP (!) 144/80   Pulse 61   Ht 5\' 2"  (1.575 m)   Wt 142 lb (64.4 kg)   SpO2 98%   BMI 25.97 kg/m   Constitutional:   Pleasant Elderly Caucasian female appears to be in NAD, Well developed, Well nourished, alert and cooperative Respiratory: Respirations even and unlabored. Lungs clear to auscultation bilaterally.   No wheezes, crackles, or rhonchi.  Cardiovascular: Normal S1, S2. No MRG. Regular rate and rhythm. No peripheral edema, cyanosis or pallor.  Gastrointestinal:  Soft, nondistended, nontender. No rebound or guarding. Normal bowel sounds. No appreciable masses or hepatomegaly. Rectal:  Declined Msk:  Symmetrical without gross deformities. Without edema, no deformity  or joint abnormality.  Psychiatric: Demonstrates good judgement and reason without abnormal affect or behaviors.  No recent labs or imaging.  Assessment: 1.  Rectal bleeding: For about 6 days, bright red blood, constipated at the time ; most likely hemorrhoids  2.  History of colon cancer: Status post partial colectomy in 2008, last colonoscopy February 2019 was normal 3.  Constipation: Occasional problems with this, likely related to diet/water intake/age  4.  GERD: Increased recently; most likely gastritis  Plan: 1.  Discussed with patient that most likely the bleeding she had was from hemorrhoids given her constipation around that timeframe and it resolving on its own.  Did offer a rectal exam today but patient declined.  Told her to call us if she has another episode at all.  She could try over-the-counter Preparation H suppositories daily x7 days, but if bleeding does not resolve then would recommend that she alert [place], she may need a colonoscopy given her history. 2.  Discussed constipation, she did buy over-the-counter stool softeners at the time but never used them, discussed that she could do this as needed if she feels like she is constipated. 3.  Recommend the patient increase her AcipHex to 20 mg once daily for the next month and then decrease back to as needed to see if this helps with the increase in reflux symptoms she has been experiencing. 4.  Patient to follow in clinic as needed with [place] in the near future.  Ellouise Newer, PA-C [facility] 09/12/2020, 10:00 AM  Cc: Janie Morning, DO

## 2020-09-12 NOTE — Patient Instructions (Signed)
If you are age 82 or older, your body mass index should be between 23-30. Your Body mass index is 25.97 kg/m. If this is out of the aforementioned range listed, please consider follow up with your Primary Care Provider.  If you are age 17 or younger, your body mass index should be between 19-25. Your Body mass index is 25.97 kg/m. If this is out of the aformentioned range listed, please consider follow up with your Primary Care Provider.   Please take your Aciphex once daily 20 minutes before breakfast for a month.  If you start bleeding again start over the counter preparation H suppository once daily for seven days.  Thank you for choosing me and [facility].  Ellouise Newer, PA-C

## 2020-09-12 NOTE — Progress Notes (Signed)
____________________________________________________________  Attending physician addendum:  Thank you for sending this case to me. I have reviewed the entire note and agree with the plan.  I agree with increasing the acid suppression therapy for worsened reflux symptoms. This sounds most like a benign anal bleeding related to constipation, and I suspect constipation is a combination of diverticulosis and possible age-related pelvic floor dysfunction. Stool softener and low-dose MiraLAX is a good place to start with this. If she has ongoing bleeding despite doing that, would like to see her in clinic if she is agreeable to having rectal exam/anoscopy performed.  Wilfrid Lund, MD  ____________________________________________________________

## 2020-09-20 DIAGNOSIS — M9903 Segmental and somatic dysfunction of lumbar region: Secondary | ICD-10-CM | POA: Diagnosis not present

## 2020-09-20 DIAGNOSIS — M9904 Segmental and somatic dysfunction of sacral region: Secondary | ICD-10-CM | POA: Diagnosis not present

## 2020-09-20 DIAGNOSIS — M5136 Other intervertebral disc degeneration, lumbar region: Secondary | ICD-10-CM | POA: Diagnosis not present

## 2020-09-20 DIAGNOSIS — M9905 Segmental and somatic dysfunction of pelvic region: Secondary | ICD-10-CM | POA: Diagnosis not present

## 2020-09-24 DIAGNOSIS — M9905 Segmental and somatic dysfunction of pelvic region: Secondary | ICD-10-CM | POA: Diagnosis not present

## 2020-09-24 DIAGNOSIS — M9904 Segmental and somatic dysfunction of sacral region: Secondary | ICD-10-CM | POA: Diagnosis not present

## 2020-09-24 DIAGNOSIS — M5136 Other intervertebral disc degeneration, lumbar region: Secondary | ICD-10-CM | POA: Diagnosis not present

## 2020-09-24 DIAGNOSIS — M9903 Segmental and somatic dysfunction of lumbar region: Secondary | ICD-10-CM | POA: Diagnosis not present

## 2020-09-26 DIAGNOSIS — M5136 Other intervertebral disc degeneration, lumbar region: Secondary | ICD-10-CM | POA: Diagnosis not present

## 2020-09-26 DIAGNOSIS — M9903 Segmental and somatic dysfunction of lumbar region: Secondary | ICD-10-CM | POA: Diagnosis not present

## 2020-09-26 DIAGNOSIS — M9905 Segmental and somatic dysfunction of pelvic region: Secondary | ICD-10-CM | POA: Diagnosis not present

## 2020-09-26 DIAGNOSIS — M9904 Segmental and somatic dysfunction of sacral region: Secondary | ICD-10-CM | POA: Diagnosis not present

## 2020-10-04 DIAGNOSIS — J4540 Moderate persistent asthma, uncomplicated: Secondary | ICD-10-CM | POA: Diagnosis not present

## 2020-10-04 DIAGNOSIS — I872 Venous insufficiency (chronic) (peripheral): Secondary | ICD-10-CM | POA: Diagnosis not present

## 2020-10-04 DIAGNOSIS — E7800 Pure hypercholesterolemia, unspecified: Secondary | ICD-10-CM | POA: Diagnosis not present

## 2020-10-04 DIAGNOSIS — I10 Essential (primary) hypertension: Secondary | ICD-10-CM | POA: Diagnosis not present

## 2020-10-04 DIAGNOSIS — K219 Gastro-esophageal reflux disease without esophagitis: Secondary | ICD-10-CM | POA: Diagnosis not present

## 2020-10-08 DIAGNOSIS — I10 Essential (primary) hypertension: Secondary | ICD-10-CM | POA: Diagnosis not present

## 2020-10-08 DIAGNOSIS — E7800 Pure hypercholesterolemia, unspecified: Secondary | ICD-10-CM | POA: Diagnosis not present

## 2020-11-06 DIAGNOSIS — E7800 Pure hypercholesterolemia, unspecified: Secondary | ICD-10-CM | POA: Diagnosis not present

## 2020-11-06 DIAGNOSIS — I10 Essential (primary) hypertension: Secondary | ICD-10-CM | POA: Diagnosis not present

## 2020-11-19 ENCOUNTER — Telehealth: Payer: Self-pay | Admitting: Endocrinology

## 2020-11-19 NOTE — Telephone Encounter (Signed)
Patient requests to be called at ph# [phone] re: Patient states Dr. Dwyane Dee told her to call him if she experienced any of the following symptoms, which Patient states she is experiencing the following symptoms:  Extreme sensitivity to cold Extremely fatigued Forgetfulness (which is new)  The 3 above symptoms are what Patient can remember that are the worst

## 2020-11-19 NOTE — Telephone Encounter (Signed)
Please schedule her for an office visit with labs prior as soon as possible

## 2020-11-23 NOTE — Telephone Encounter (Signed)
Patient is scheduled for labs on 11/26/20 and Dr. Dwyane Dee on 11/29/20

## 2020-11-26 ENCOUNTER — Other Ambulatory Visit (INDEPENDENT_AMBULATORY_CARE_PROVIDER_SITE_OTHER): Payer: Medicare Other

## 2020-11-26 ENCOUNTER — Other Ambulatory Visit: Payer: Self-pay

## 2020-11-26 DIAGNOSIS — E038 Other specified hypothyroidism: Secondary | ICD-10-CM

## 2020-11-26 LAB — TSH: TSH: 5.84 u[IU]/mL — ABNORMAL HIGH (ref 0.35–4.50)

## 2020-11-26 LAB — T4, FREE: Free T4: 0.76 ng/dL (ref 0.60–1.60)

## 2020-11-29 ENCOUNTER — Ambulatory Visit: Payer: Medicare Other | Admitting: Endocrinology

## 2020-11-29 NOTE — Telephone Encounter (Signed)
Noted,  Thank you!

## 2020-12-07 DIAGNOSIS — I10 Essential (primary) hypertension: Secondary | ICD-10-CM | POA: Diagnosis not present

## 2020-12-07 DIAGNOSIS — J4540 Moderate persistent asthma, uncomplicated: Secondary | ICD-10-CM | POA: Diagnosis not present

## 2020-12-07 DIAGNOSIS — K219 Gastro-esophageal reflux disease without esophagitis: Secondary | ICD-10-CM | POA: Diagnosis not present

## 2020-12-07 DIAGNOSIS — Z23 Encounter for immunization: Secondary | ICD-10-CM | POA: Diagnosis not present

## 2020-12-07 DIAGNOSIS — M8589 Other specified disorders of bone density and structure, multiple sites: Secondary | ICD-10-CM | POA: Diagnosis not present

## 2020-12-07 DIAGNOSIS — Z0000 Encounter for general adult medical examination without abnormal findings: Secondary | ICD-10-CM | POA: Diagnosis not present

## 2020-12-07 DIAGNOSIS — E7800 Pure hypercholesterolemia, unspecified: Secondary | ICD-10-CM | POA: Diagnosis not present

## 2020-12-11 ENCOUNTER — Other Ambulatory Visit: Payer: Self-pay

## 2020-12-11 ENCOUNTER — Ambulatory Visit (INDEPENDENT_AMBULATORY_CARE_PROVIDER_SITE_OTHER): Payer: Medicare Other | Admitting: Endocrinology

## 2020-12-11 VITALS — BP 160/70 | HR 69 | Ht 62.0 in | Wt 144.0 lb

## 2020-12-11 DIAGNOSIS — E063 Autoimmune thyroiditis: Secondary | ICD-10-CM

## 2020-12-11 MED ORDER — THYROID 15 MG PO TABS: 15.0000 mg | ORAL_TABLET | Freq: Every day | ORAL | 2 refills | Status: DC

## 2020-12-11 NOTE — Progress Notes (Signed)
Patient ID: Jasmine Velez, female   DOB: 1941/03/25, 80 y.o.   MRN: [id number]             Reason for Appointment:  Hypothyroidism, new visit    History of Present Illness:   Hypothyroidism was first diagnosed in 09/2018  At the time of diagnosis patient was having routine lab work and TSH was 6.7 She did not have at that time any symptoms of fatigue, cold sensitivity or weight change Not clear if she had abnormal thyroid levels prior to that           The patient has been treated with both levothyroxine and Armour Thyroid When she first started supplementation in 3/20 she was tried on unknown dose of levothyroxine but she thinks it caused her to have more reflux symptoms and she was switched to Armour Thyroid 15 mg daily    With starting thyroid supplementation she did not feel any different  Subsequently her TSH was monitored periodically and ranged between 5.7 and 6.0 in 2020 In 10/2019 she was seen by a different healthcare professional and even though her TSH was only 4.1 she was told to go up to 30 mg of Armour Thyroid Did not again feel any different with the dosage change Subsequent TSH 2 months later in 12/2019 was 2.8  In late summer 2021 she thinks she was starting to have more fatigue overall and continued until this month However she did not complain of any palpitations, shakiness, unusual weight loss or heat intolerance  She had an episode of generalized rash on 06/14/2020 which was treated by dermatologist as allergic dermatitis She was told to stop her Armour Thyroid on 06/20/2020 when her TSH was 0.01 TSH done on 07/02/20 was reportedly 3.6  RECENT history:  She has been off any thyroid supplements since 12/20//21 Since her initial consultation in 06/2020 she was told not to take any thyroid supplements since she was felt to have subclinical hypothyroidism  Although on her last visit in 3/22 she was feeling overall fairly good for the last 6 weeks or so she is having  more symptoms of feeling more fatigued and needing to take a nap in the afternoon as well as being more sensitive to cold She is also noticing some forgetfulness Has some persistent dry skin Her weight is overall about the same Does not think she has any insomnia  Her TSH is 5.8 compared to 5.5 previously         Patient's weight history is as follows:  Wt Readings from Last 3 Encounters:  12/11/20 144 lb (65.3 kg)  09/12/20 142 lb (64.4 kg)  09/11/20 144 lb (65.3 kg)    Thyroid function results have been as follows:  Lab Results  Component Value Date   TSH 5.84 (H) 11/26/2020   TSH 5.52 (H) 09/07/2020   FREET4 0.76 11/26/2020   FREET4 0.91 09/07/2020     Past Medical History:  Diagnosis Date  . Allergy    sinus issues with weather changes   . 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 Asthma   . Cancer [facility])    colon cancer   . Cataract    removed both eyes   . GERD (gastroesophageal reflux disease)   . Hyperlipidemia   . PUD (peptic ulcer disease)    25 yrs ago   . Sciatica   . UTI (lower urinary tract infection)     Past Surgical History:  Procedure Laterality  Date  . CATARACT EXTRACTION  2013, 2014  . COLON RESECTION  2008  . COLONOSCOPY    . LASIK  2000  . NASAL SEPTUM SURGERY  1980's  . PILONIDAL CYST EXCISION  1960  . TONSILLECTOMY  1960  . TOTAL HIP ARTHROPLASTY  1994    Family History  Problem Relation Age of Onset  . CAD Father   . Dementia Mother   . Dementia Sister   . Colon polyps Neg Hx   . Colon cancer Neg Hx   . Rectal cancer Neg Hx   . Stomach cancer Neg Hx   . Thyroid disease Neg Hx   . Esophageal cancer Neg Hx   . Liver disease Neg Hx   . Pancreatic cancer Neg Hx     Social History:  reports that she has never smoked. She has never used smokeless tobacco. She reports current alcohol use. She reports that she does not use drugs.  Allergies:  Allergies  Allergen Reactions  . Other Other (See Comments)    Green, yellow, orange  bell peppers causes severe gastric distress   . Ceclor [Cefaclor] Nausea Only    Gerd  . Nexium [Esomeprazole Magnesium] Nausea Only    sick  . Statins     Muscle pain, nausea, reflux  . Tramadol Nausea And Vomiting  . Erythromycin Rash    In mouth  . Penicillins Rash    In her mouth  . Sulfa Antibiotics Rash    Allergies as of 12/11/2020      Reactions   Other Other (See Comments)   Green, yellow, orange bell peppers causes severe gastric distress    Ceclor [cefaclor] Nausea Only   Gerd   Nexium [esomeprazole Magnesium] Nausea Only   sick   Statins    Muscle pain, nausea, reflux   Tramadol Nausea And Vomiting   Erythromycin Rash   In mouth   Penicillins Rash   In her mouth   Sulfa Antibiotics Rash      Medication List       Accurate as of Dec 11, 2020  9:26 AM. If you have any questions, ask your nurse or doctor.        albuterol 108 (90 Base) MCG/ACT inhaler Commonly known as: ProAir HFA Inhale 1-2 puffs into the lungs every 6 (six) hours as needed for wheezing or shortness of breath.   budesonide 180 MCG/ACT inhaler Commonly known as: PULMICORT Inhale 1 puff 2 (two) times daily into the lungs.   calcium-vitamin D 500-200 MG-UNIT tablet Commonly known as: OSCAL WITH D Take 1 tablet by mouth daily.   fluticasone 50 MCG/ACT nasal spray Commonly known as: FLONASE Place 2 sprays into both nostrils as needed for allergies or rhinitis.   hydrochlorothiazide 25 MG tablet Commonly known as: HYDRODIURIL Take 25 mg by mouth daily as needed.   LUTEIN PO One every other day   multivitamin with minerals Tabs tablet Take 1 tablet by mouth daily.   nystatin cream Commonly known as: MYCOSTATIN Apply 1 application topically as needed.   pravastatin 20 MG tablet Commonly known as: PRAVACHOL Take 40 mg by mouth every other day.   RABEprazole 20 MG tablet Commonly known as: ACIPHEX Take 20 mg by mouth as needed.          Review of Systems  She is  taking HydroDIURIL only when she has edema of legs      Recent home BP 139/57        Examination:  BP (!) 160/70 (BP Location: Right Arm, Patient Position: Sitting, Cuff Size: Large)   Pulse 69   Ht 5\' 2"  (1.575 m)   Wt 144 lb (65.3 kg)   SpO2 97%   BMI 26.34 kg/m   No facial puffiness Thyroid nonpalpable Biceps reflexes show normal relaxation Minimal lower leg edema  Assessment:  HYPOTHYROIDISM, mild with highest TSH 6.7 At baseline she was asymptomatic and did not have change in her mild fatigue with thyroid supplementation No goiter on exam  She is complaining of significant fatigue and some forgetfulness as well as cold intolerance Exam is unremarkable today   PLAN:   She may be now symptomatic from her hypothyroidism although her TSH is not higher than usual No other etiology of her fatigue is evident  Since she apparently had GI intolerance to levothyroxine she can start on Armour Thyroid 15 mg before breakfast daily again She will follow-up in another month as scheduled If she is not better in about 2 weeks she will follow-up with her PCP to have further evaluation done   Elayne Snare 12/11/2020, 9:26 AM    Note: This office note was prepared with Dragon voice recognition system technology. Any transcriptional errors that result from this process are unintentional.

## 2020-12-20 DIAGNOSIS — H26491 Other secondary cataract, right eye: Secondary | ICD-10-CM | POA: Diagnosis not present

## 2020-12-20 DIAGNOSIS — M1711 Unilateral primary osteoarthritis, right knee: Secondary | ICD-10-CM | POA: Diagnosis not present

## 2020-12-20 DIAGNOSIS — Z9889 Other specified postprocedural states: Secondary | ICD-10-CM | POA: Diagnosis not present

## 2020-12-20 DIAGNOSIS — Z961 Presence of intraocular lens: Secondary | ICD-10-CM | POA: Diagnosis not present

## 2020-12-20 DIAGNOSIS — H353132 Nonexudative age-related macular degeneration, bilateral, intermediate dry stage: Secondary | ICD-10-CM | POA: Diagnosis not present

## 2020-12-20 DIAGNOSIS — H02834 Dermatochalasis of left upper eyelid: Secondary | ICD-10-CM | POA: Diagnosis not present

## 2020-12-20 DIAGNOSIS — M25562 Pain in left knee: Secondary | ICD-10-CM | POA: Diagnosis not present

## 2020-12-20 DIAGNOSIS — H43811 Vitreous degeneration, right eye: Secondary | ICD-10-CM | POA: Diagnosis not present

## 2020-12-20 DIAGNOSIS — H04123 Dry eye syndrome of bilateral lacrimal glands: Secondary | ICD-10-CM | POA: Diagnosis not present

## 2020-12-20 DIAGNOSIS — H02831 Dermatochalasis of right upper eyelid: Secondary | ICD-10-CM | POA: Diagnosis not present

## 2020-12-26 DIAGNOSIS — M9903 Segmental and somatic dysfunction of lumbar region: Secondary | ICD-10-CM | POA: Diagnosis not present

## 2020-12-26 DIAGNOSIS — M9905 Segmental and somatic dysfunction of pelvic region: Secondary | ICD-10-CM | POA: Diagnosis not present

## 2020-12-26 DIAGNOSIS — M5136 Other intervertebral disc degeneration, lumbar region: Secondary | ICD-10-CM | POA: Diagnosis not present

## 2020-12-26 DIAGNOSIS — M9904 Segmental and somatic dysfunction of sacral region: Secondary | ICD-10-CM | POA: Diagnosis not present

## 2020-12-27 ENCOUNTER — Other Ambulatory Visit: Payer: Self-pay

## 2020-12-27 ENCOUNTER — Encounter (INDEPENDENT_AMBULATORY_CARE_PROVIDER_SITE_OTHER): Payer: Medicare Other | Admitting: Ophthalmology

## 2020-12-27 DIAGNOSIS — M5136 Other intervertebral disc degeneration, lumbar region: Secondary | ICD-10-CM | POA: Diagnosis not present

## 2020-12-27 DIAGNOSIS — M9905 Segmental and somatic dysfunction of pelvic region: Secondary | ICD-10-CM | POA: Diagnosis not present

## 2020-12-27 DIAGNOSIS — H353132 Nonexudative age-related macular degeneration, bilateral, intermediate dry stage: Secondary | ICD-10-CM | POA: Diagnosis not present

## 2020-12-27 DIAGNOSIS — H43813 Vitreous degeneration, bilateral: Secondary | ICD-10-CM | POA: Diagnosis not present

## 2020-12-27 DIAGNOSIS — M9903 Segmental and somatic dysfunction of lumbar region: Secondary | ICD-10-CM | POA: Diagnosis not present

## 2020-12-27 DIAGNOSIS — M9904 Segmental and somatic dysfunction of sacral region: Secondary | ICD-10-CM | POA: Diagnosis not present

## 2020-12-31 DIAGNOSIS — M9905 Segmental and somatic dysfunction of pelvic region: Secondary | ICD-10-CM | POA: Diagnosis not present

## 2020-12-31 DIAGNOSIS — M5136 Other intervertebral disc degeneration, lumbar region: Secondary | ICD-10-CM | POA: Diagnosis not present

## 2020-12-31 DIAGNOSIS — M9903 Segmental and somatic dysfunction of lumbar region: Secondary | ICD-10-CM | POA: Diagnosis not present

## 2020-12-31 DIAGNOSIS — M9904 Segmental and somatic dysfunction of sacral region: Secondary | ICD-10-CM | POA: Diagnosis not present

## 2021-01-07 ENCOUNTER — Other Ambulatory Visit (INDEPENDENT_AMBULATORY_CARE_PROVIDER_SITE_OTHER): Payer: Medicare Other

## 2021-01-07 ENCOUNTER — Other Ambulatory Visit: Payer: Self-pay

## 2021-01-07 DIAGNOSIS — E063 Autoimmune thyroiditis: Secondary | ICD-10-CM | POA: Diagnosis not present

## 2021-01-07 LAB — TSH: TSH: 4.17 u[IU]/mL (ref 0.35–4.50)

## 2021-01-07 LAB — T4, FREE: Free T4: 0.89 ng/dL (ref 0.60–1.60)

## 2021-01-07 LAB — T3, FREE: T3, Free: 3.6 pg/mL (ref 2.3–4.2)

## 2021-01-10 ENCOUNTER — Other Ambulatory Visit: Payer: Self-pay

## 2021-01-10 ENCOUNTER — Encounter: Payer: Self-pay | Admitting: Endocrinology

## 2021-01-10 ENCOUNTER — Ambulatory Visit (INDEPENDENT_AMBULATORY_CARE_PROVIDER_SITE_OTHER): Payer: Medicare Other | Admitting: Endocrinology

## 2021-01-10 ENCOUNTER — Ambulatory Visit: Payer: Medicare Other | Admitting: Endocrinology

## 2021-01-10 VITALS — BP 140/68 | HR 62 | Ht 61.5 in | Wt 142.0 lb

## 2021-01-10 DIAGNOSIS — E063 Autoimmune thyroiditis: Secondary | ICD-10-CM | POA: Diagnosis not present

## 2021-01-10 NOTE — Progress Notes (Signed)
Patient ID: Jasmine Velez, female   DOB: [birth year], 80 y.o.   MRN: [id number]             Reason for Appointment:  Hypothyroidism, new visit    History of Present Illness:   Hypothyroidism was first diagnosed in 09/2018  At the time of diagnosis patient was having routine lab work and TSH was 6.7 She did not have at that time any symptoms of fatigue, cold sensitivity or weight change Not clear if she had abnormal thyroid levels prior to that           The patient has been treated with both levothyroxine and Armour Thyroid When she first started supplementation in 3/20 she was tried on unknown dose of levothyroxine but she thinks it caused her to have more reflux symptoms and she was switched to Armour Thyroid 15 mg daily    With starting thyroid supplementation she did not feel any different  Subsequently her TSH was monitored periodically and ranged between 5.7 and 6.0 in 2020 In 10/2019 she was seen by a different healthcare professional and even though her TSH was only 4.1 she was told to go up to 30 mg of Armour Thyroid Did not again feel any different with the dosage change Subsequent TSH 2 months later in 12/2019 was 2.8  In late summer 2021 she thinks she was starting to have more fatigue overall and continued until this month However she did not complain of any palpitations, shakiness, unusual weight loss or heat intolerance  She had an episode of generalized rash on 06/14/2020 which was treated by dermatologist as allergic dermatitis She was told to stop her Armour Thyroid on 06/20/2020 when her TSH was 0.01 TSH done on 07/02/20 was reportedly 3.6  RECENT history:  At her initial consultation in 06/2020 she was told not to take any thyroid supplements since she was felt to have subclinical hypothyroidism with 50 mcg of Armour Thyroid  She came back in 5/22 since she was having more symptoms of feeling more fatigued and needing to take a nap in the afternoon as well as being  more sensitive to cold, also noticing some forgetfulness  Since she was symptomatic and her TSH was higher at 5.8 she was started back on Armour Thyroid 15 mg daily Takes this every morning after waking up without food Takes her vitamins at lunchtime  About 2 weeks after starting the supplement she started feeling better and she is having less fatigue although not back to normal yet  TSH is back to normal as below         Patient's weight history is as follows:  Wt Readings from Last 3 Encounters:  01/10/21 142 lb (64.4 kg)  12/11/20 144 lb (65.3 kg)  09/12/20 142 lb (64.4 kg)    Thyroid function results have been as follows:  Lab Results  Component Value Date   TSH 4.17 01/07/2021   TSH 5.84 (H) 11/26/2020   TSH 5.52 (H) 09/07/2020   FREET4 0.89 01/07/2021   FREET4 0.76 11/26/2020   FREET4 0.91 09/07/2020   T3FREE 3.6 01/07/2021     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    Asthma    Cancer ([place])    colon cancer    Cataract    removed both eyes    GERD (gastroesophageal reflux disease)  Hyperlipidemia    PUD (peptic ulcer disease)    25 yrs ago    Sciatica    UTI (lower urinary tract infection)     Past Surgical History:  Procedure Laterality Date   CATARACT EXTRACTION  2013, 2014   COLON RESECTION  2008   COLONOSCOPY     LASIK  2000   NASAL SEPTUM SURGERY  1980's   PILONIDAL CYST EXCISION  1960   TONSILLECTOMY  1960   TOTAL HIP ARTHROPLASTY  1994    Family History  Problem Relation Age of Onset   CAD Father    Dementia Mother    Dementia Sister    Colon polyps Neg Hx    Colon cancer Neg Hx    Rectal cancer Neg Hx    Stomach cancer Neg Hx    Thyroid disease Neg Hx    Esophageal cancer Neg Hx    Liver disease Neg Hx    Pancreatic cancer Neg Hx     Social History:  reports that she has never smoked. She has never used smokeless tobacco. She reports current  alcohol use. She reports that she does not use drugs.  Allergies:  Allergies  Allergen Reactions   Other Other (See Comments)    Green, yellow, orange bell peppers causes severe gastric distress    Amlodipine Nausea Only   Ceclor [Cefaclor] Nausea Only    Gerd   Nexium [Esomeprazole Magnesium] Nausea Only    sick   Statins     Muscle pain, nausea, reflux   Tramadol Nausea And Vomiting   Erythromycin Rash    In mouth   Penicillins Rash    In her mouth   Sulfa Antibiotics Rash    Allergies as of 01/10/2021       Reactions   Other Other (See Comments)   Green, yellow, orange bell peppers causes severe gastric distress    Amlodipine Nausea Only   Ceclor [cefaclor] Nausea Only   Gerd   Nexium [esomeprazole Magnesium] Nausea Only   sick   Statins    Muscle pain, nausea, reflux   Tramadol Nausea And Vomiting   Erythromycin Rash   In mouth   Penicillins Rash   In her mouth   Sulfa Antibiotics Rash        Medication List        Accurate as of January 10, 2021  9:03 AM. If you have any questions, ask your nurse or doctor.          albuterol 108 (90 Base) MCG/ACT inhaler Commonly known as: ProAir HFA Inhale 1-2 puffs into the lungs every 6 (six) hours as needed for wheezing or shortness of breath.   budesonide 180 MCG/ACT inhaler Commonly known as: PULMICORT Inhale 1 puff 2 (two) times daily into the lungs.   calcium-vitamin D 500-200 MG-UNIT tablet Commonly known as: OSCAL WITH D Take 1 tablet by mouth daily.   fluticasone 50 MCG/ACT nasal spray Commonly known as: FLONASE Place 2 sprays into both nostrils as needed for allergies or rhinitis.   hydrochlorothiazide 25 MG tablet Commonly known as: HYDRODIURIL Take 25 mg by mouth daily as needed.   LUTEIN PO One every other day   multivitamin with minerals Tabs tablet Take 1 tablet by mouth daily.   nystatin cream Commonly known as: MYCOSTATIN Apply 1 application topically as needed.   pravastatin 20  MG tablet Commonly known as: PRAVACHOL Take 40 mg by mouth every other day.   RABEprazole 20 MG tablet  Commonly known as: ACIPHEX Take 20 mg by mouth as needed.   thyroid 15 MG tablet Commonly known as: Armour Thyroid Take 1 tablet (15 mg total) by mouth daily.           Review of Systems  She is taking HydroDIURIL only when she has edema of legs      Recent home BP 139/57        Examination:    BP 140/68   Pulse 62   Ht 5' 1.5" (1.562 m)   Wt 142 lb (64.4 kg)   SpO2 98%   BMI 26.40 kg/m   Exam not indicated  Assessment:  HYPOTHYROIDISM, mild with highest TSH 6.7[ No goiter on exam  About a month ago she was having significant fatigue and some forgetfulness as well as cold intolerance This is improving significantly with starting back on Armour Thyroid TSH is back to normal   PLAN:   She likely has had symptomatic hypothyroidism  Since she apparently had GI intolerance to levothyroxine she can continue on Armour Thyroid 15 mg before breakfast Unlikely that she will need a higher dose especially with her free T3 level being in the higher end of the range Discussed that since 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 Any transcriptional errors that result from this process are unintentional.

## 2021-02-11 DIAGNOSIS — L719 Rosacea, unspecified: Secondary | ICD-10-CM | POA: Diagnosis not present

## 2021-02-11 DIAGNOSIS — L219 Seborrheic dermatitis, unspecified: Secondary | ICD-10-CM | POA: Diagnosis not present

## 2021-02-11 DIAGNOSIS — L309 Dermatitis, unspecified: Secondary | ICD-10-CM | POA: Diagnosis not present

## 2021-02-15 ENCOUNTER — Other Ambulatory Visit: Payer: Self-pay | Admitting: Endocrinology

## 2021-02-28 DIAGNOSIS — M25551 Pain in right hip: Secondary | ICD-10-CM | POA: Diagnosis not present

## 2021-02-28 DIAGNOSIS — M545 Low back pain, unspecified: Secondary | ICD-10-CM | POA: Diagnosis not present

## 2021-02-28 DIAGNOSIS — M5441 Lumbago with sciatica, right side: Secondary | ICD-10-CM | POA: Diagnosis not present

## 2021-03-12 DIAGNOSIS — M25561 Pain in right knee: Secondary | ICD-10-CM | POA: Diagnosis not present

## 2021-03-12 DIAGNOSIS — M7061 Trochanteric bursitis, right hip: Secondary | ICD-10-CM | POA: Diagnosis not present

## 2021-03-12 DIAGNOSIS — M1711 Unilateral primary osteoarthritis, right knee: Secondary | ICD-10-CM | POA: Diagnosis not present

## 2021-03-13 DIAGNOSIS — M25561 Pain in right knee: Secondary | ICD-10-CM | POA: Diagnosis not present

## 2021-03-13 DIAGNOSIS — M7061 Trochanteric bursitis, right hip: Secondary | ICD-10-CM | POA: Diagnosis not present

## 2021-03-13 DIAGNOSIS — M1711 Unilateral primary osteoarthritis, right knee: Secondary | ICD-10-CM | POA: Diagnosis not present

## 2021-03-20 DIAGNOSIS — M1711 Unilateral primary osteoarthritis, right knee: Secondary | ICD-10-CM | POA: Diagnosis not present

## 2021-03-20 DIAGNOSIS — M7061 Trochanteric bursitis, right hip: Secondary | ICD-10-CM | POA: Diagnosis not present

## 2021-03-20 DIAGNOSIS — M25561 Pain in right knee: Secondary | ICD-10-CM | POA: Diagnosis not present

## 2021-03-25 DIAGNOSIS — M7061 Trochanteric bursitis, right hip: Secondary | ICD-10-CM | POA: Diagnosis not present

## 2021-03-25 DIAGNOSIS — M1711 Unilateral primary osteoarthritis, right knee: Secondary | ICD-10-CM | POA: Diagnosis not present

## 2021-03-25 DIAGNOSIS — M25651 Stiffness of right hip, not elsewhere classified: Secondary | ICD-10-CM | POA: Diagnosis not present

## 2021-03-29 DIAGNOSIS — M1711 Unilateral primary osteoarthritis, right knee: Secondary | ICD-10-CM | POA: Diagnosis not present

## 2021-03-29 DIAGNOSIS — M7061 Trochanteric bursitis, right hip: Secondary | ICD-10-CM | POA: Diagnosis not present

## 2021-03-29 DIAGNOSIS — M25561 Pain in right knee: Secondary | ICD-10-CM | POA: Diagnosis not present

## 2021-04-01 DIAGNOSIS — M7061 Trochanteric bursitis, right hip: Secondary | ICD-10-CM | POA: Diagnosis not present

## 2021-04-01 DIAGNOSIS — M25561 Pain in right knee: Secondary | ICD-10-CM | POA: Diagnosis not present

## 2021-04-01 DIAGNOSIS — M1711 Unilateral primary osteoarthritis, right knee: Secondary | ICD-10-CM | POA: Diagnosis not present

## 2021-04-05 DIAGNOSIS — M7061 Trochanteric bursitis, right hip: Secondary | ICD-10-CM | POA: Diagnosis not present

## 2021-04-05 DIAGNOSIS — M25561 Pain in right knee: Secondary | ICD-10-CM | POA: Diagnosis not present

## 2021-04-05 DIAGNOSIS — M1711 Unilateral primary osteoarthritis, right knee: Secondary | ICD-10-CM | POA: Diagnosis not present

## 2021-04-08 ENCOUNTER — Other Ambulatory Visit: Payer: Medicare Other

## 2021-04-08 ENCOUNTER — Other Ambulatory Visit (INDEPENDENT_AMBULATORY_CARE_PROVIDER_SITE_OTHER): Payer: Medicare Other

## 2021-04-08 DIAGNOSIS — E063 Autoimmune thyroiditis: Secondary | ICD-10-CM | POA: Diagnosis not present

## 2021-04-08 LAB — T4, FREE: Free T4: 0.88 ng/dL (ref 0.60–1.60)

## 2021-04-08 LAB — T3, FREE: T3, Free: 3.2 pg/mL (ref 2.3–4.2)

## 2021-04-08 LAB — TSH: TSH: 5.04 u[IU]/mL (ref 0.35–5.50)

## 2021-04-09 DIAGNOSIS — M1711 Unilateral primary osteoarthritis, right knee: Secondary | ICD-10-CM | POA: Diagnosis not present

## 2021-04-09 DIAGNOSIS — M7061 Trochanteric bursitis, right hip: Secondary | ICD-10-CM | POA: Diagnosis not present

## 2021-04-09 DIAGNOSIS — M25561 Pain in right knee: Secondary | ICD-10-CM | POA: Diagnosis not present

## 2021-04-11 ENCOUNTER — Ambulatory Visit (INDEPENDENT_AMBULATORY_CARE_PROVIDER_SITE_OTHER): Payer: Medicare Other | Admitting: Endocrinology

## 2021-04-11 ENCOUNTER — Other Ambulatory Visit: Payer: Self-pay

## 2021-04-11 ENCOUNTER — Encounter: Payer: Self-pay | Admitting: Endocrinology

## 2021-04-11 VITALS — BP 138/76 | HR 69 | Ht 61.75 in | Wt 142.6 lb

## 2021-04-11 DIAGNOSIS — E063 Autoimmune thyroiditis: Secondary | ICD-10-CM | POA: Diagnosis not present

## 2021-04-11 NOTE — Progress Notes (Signed)
Patient ID: Jasmine Velez, female   DOB: [birth year], 80 y.o.   MRN: [id number]             Reason for Appointment:  Hypothyroidism, follow-up visit    History of Present Illness:   Hypothyroidism was first diagnosed in 09/2018  At the time of diagnosis patient was having routine lab work and TSH was 6.7 She did not have at that time any symptoms of fatigue, cold sensitivity or weight change Not clear if she had abnormal thyroid levels prior to that           The patient has been treated with both levothyroxine and Armour Thyroid When she first started supplementation in 3/20 she was tried on unknown dose of levothyroxine but she thinks it caused her to have more reflux symptoms and she was switched to Armour Thyroid 15 mg daily    With starting thyroid supplementation she did not feel any different  Subsequently her TSH was monitored periodically and ranged between 5.7 and 6.0 in 2020 In 10/2019 she was seen by a different healthcare professional and even though her TSH was only 4.1 she was told to go up to 30 mg of Armour Thyroid Did not again feel any different with the dosage change Subsequent TSH 2 months later in 12/2019 was 2.8  In late summer 2021 she thinks she was starting to have more fatigue overall and continued until this month However she did not complain of any palpitations, shakiness, unusual weight loss or heat intolerance  She had an episode of generalized rash on 06/14/2020 which was treated by dermatologist as allergic dermatitis She was told to stop her Armour Thyroid on 06/20/2020 when her TSH was 0.01 TSH done on 07/02/20 was reportedly 3.6  RECENT history:  At her initial consultation in 06/2020 she was told not to take any thyroid supplements since she was felt to have subclinical hypothyroidism with 50 mcg of Armour Thyroid She came back in 5/22 since she was having more symptoms of feeling more fatigued and needing to take a nap in the afternoon as well as  being more sensitive to cold, also noticing some forgetfulness  Since she was symptomatic and her TSH was higher at 5.8 she was started back on Armour Thyroid 15 mg daily With this she started feeling better and the dosage has been continued unchanged  She has only minimal fatigue now and usually not needing to take a nap in the afternoon Still has some cold intolerance  She has taken her Armour Thyroid regularly on empty stomach in the mornings  TSH is upper normal now         Patient's weight history is as follows:  Wt Readings from Last 3 Encounters:  04/11/21 142 lb 9.6 oz (64.7 kg)  01/10/21 142 lb (64.4 kg)  12/11/20 144 lb (65.3 kg)    Thyroid function results have been as follows:  Lab Results  Component Value Date   TSH 5.04 04/08/2021   TSH 4.17 01/07/2021   TSH 5.84 (H) 11/26/2020   TSH 5.52 (H) 09/07/2020   FREET4 0.88 04/08/2021   FREET4 0.89 01/07/2021   FREET4 0.76 11/26/2020   FREET4 0.91 09/07/2020   T3FREE 3.2 04/08/2021   T3FREE 3.6 01/07/2021     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    Asthma    Cancer (  [place])    colon cancer    Cataract    removed both eyes    GERD (gastroesophageal reflux disease)    Hyperlipidemia    PUD (peptic ulcer disease)    25 yrs ago    Sciatica    UTI (lower urinary tract infection)     Past Surgical History:  Procedure Laterality Date   CATARACT EXTRACTION  2013, 2014   COLON RESECTION  2008   COLONOSCOPY     LASIK  2000   NASAL SEPTUM SURGERY  1980's   PILONIDAL CYST EXCISION  1960   TONSILLECTOMY  1960   TOTAL HIP ARTHROPLASTY  1994    Family History  Problem Relation Age of Onset   CAD Father    Dementia Mother    Dementia Sister    Colon polyps Neg Hx    Colon cancer Neg Hx    Rectal cancer Neg Hx    Stomach cancer Neg Hx    Thyroid disease Neg Hx    Esophageal cancer Neg Hx    Liver disease Neg Hx     Pancreatic cancer Neg Hx     Social History:  reports that she has never smoked. She has never used smokeless tobacco. She reports current alcohol use. She reports that she does not use drugs.  Allergies:  Allergies  Allergen Reactions   Other Other (See Comments)    Green, yellow, orange bell peppers causes severe gastric distress    Amlodipine Nausea Only   Ceclor [Cefaclor] Nausea Only    Gerd   Nexium [Esomeprazole Magnesium] Nausea Only    sick   Statins     Muscle pain, nausea, reflux   Tramadol Nausea And Vomiting   Erythromycin Rash    In mouth   Penicillins Rash    In her mouth   Sulfa Antibiotics Rash    Allergies as of 04/11/2021       Reactions   Other Other (See Comments)   Green, yellow, orange bell peppers causes severe gastric distress    Amlodipine Nausea Only   Ceclor [cefaclor] Nausea Only   Gerd   Nexium [esomeprazole Magnesium] Nausea Only   sick   Statins    Muscle pain, nausea, reflux   Tramadol Nausea And Vomiting   Erythromycin Rash   In mouth   Penicillins Rash   In her mouth   Sulfa Antibiotics Rash        Medication List        Accurate as of April 11, 2021  9:27 AM. If you have any questions, ask your nurse or doctor.          albuterol 108 (90 Base) MCG/ACT inhaler Commonly known as: ProAir HFA Inhale 1-2 puffs into the lungs every 6 (six) hours as needed for wheezing or shortness of breath.   Armour Thyroid 15 MG tablet Generic drug: thyroid TAKE 1 TABLET (15 MG TOTAL) BY MOUTH DAILY.   budesonide 180 MCG/ACT inhaler Commonly known as: PULMICORT Inhale 1 puff 2 (two) times daily into the lungs.   calcium-vitamin D 500-200 MG-UNIT tablet Commonly known as: OSCAL WITH D Take 1 tablet by mouth daily.   fluticasone 50 MCG/ACT nasal spray Commonly known as: FLONASE Place 2 sprays into both nostrils as needed for allergies or rhinitis.   hydrochlorothiazide 25 MG tablet Commonly known as: HYDRODIURIL Take 25 mg  by mouth daily as needed.   LUTEIN PO One every other day   multivitamin with minerals Tabs  tablet Take 1 tablet by mouth daily.   nystatin cream Commonly known as: MYCOSTATIN Apply 1 application topically as needed.   pravastatin 20 MG tablet Commonly known as: PRAVACHOL Take 40 mg by mouth every other day.   RABEprazole 20 MG tablet Commonly known as: ACIPHEX Take 20 mg by mouth as needed.           Review of Systems  She is taking HydroDIURIL only when she has edema of legs      Recent home BP 139/57        Examination:    BP 138/76   Pulse 69   Ht 5' 1.75" (1.568 m)   Wt 142 lb 9.6 oz (64.7 kg)   SpO2 99%   BMI 26.29 kg/m   Thyroid nonpalpable  Assessment:  HYPOTHYROIDISM, mild with highest TSH 6.7, symptomatic No goiter on exam  She has subjectively done well with 15 mg Armour Thyroid regularly Note she reports GI intolerance to levothyroxine  Although her TSH is trending slightly higher in the upper normal range she is not symptomatic now and continues to have fairly good energy level Free T4 level is about the same and T3 is nearly the same also as before She is regular with her supplement   PLAN:   Continue same dose and follow-up in 6 months   Ava Tangney 04/11/2021, 9:27 AM    Note: This office note was prepared with Dragon voice recognition system technology. Any transcriptional errors that result from this process are unintentional.

## 2021-04-15 DIAGNOSIS — M25561 Pain in right knee: Secondary | ICD-10-CM | POA: Diagnosis not present

## 2021-04-15 DIAGNOSIS — M1711 Unilateral primary osteoarthritis, right knee: Secondary | ICD-10-CM | POA: Diagnosis not present

## 2021-04-15 DIAGNOSIS — M7061 Trochanteric bursitis, right hip: Secondary | ICD-10-CM | POA: Diagnosis not present

## 2021-04-17 DIAGNOSIS — M7061 Trochanteric bursitis, right hip: Secondary | ICD-10-CM | POA: Diagnosis not present

## 2021-04-17 DIAGNOSIS — M25561 Pain in right knee: Secondary | ICD-10-CM | POA: Diagnosis not present

## 2021-04-17 DIAGNOSIS — M1711 Unilateral primary osteoarthritis, right knee: Secondary | ICD-10-CM | POA: Diagnosis not present

## 2021-05-13 DIAGNOSIS — M1711 Unilateral primary osteoarthritis, right knee: Secondary | ICD-10-CM | POA: Diagnosis not present

## 2021-05-13 DIAGNOSIS — M7061 Trochanteric bursitis, right hip: Secondary | ICD-10-CM | POA: Diagnosis not present

## 2021-05-13 DIAGNOSIS — M25561 Pain in right knee: Secondary | ICD-10-CM | POA: Diagnosis not present

## 2021-05-14 DIAGNOSIS — M7061 Trochanteric bursitis, right hip: Secondary | ICD-10-CM | POA: Diagnosis not present

## 2021-05-14 DIAGNOSIS — M25551 Pain in right hip: Secondary | ICD-10-CM | POA: Diagnosis not present

## 2021-05-15 DIAGNOSIS — M9905 Segmental and somatic dysfunction of pelvic region: Secondary | ICD-10-CM | POA: Diagnosis not present

## 2021-05-15 DIAGNOSIS — M9904 Segmental and somatic dysfunction of sacral region: Secondary | ICD-10-CM | POA: Diagnosis not present

## 2021-05-15 DIAGNOSIS — M9903 Segmental and somatic dysfunction of lumbar region: Secondary | ICD-10-CM | POA: Diagnosis not present

## 2021-05-15 DIAGNOSIS — M5136 Other intervertebral disc degeneration, lumbar region: Secondary | ICD-10-CM | POA: Diagnosis not present

## 2021-05-16 DIAGNOSIS — M25561 Pain in right knee: Secondary | ICD-10-CM | POA: Diagnosis not present

## 2021-05-16 DIAGNOSIS — M7061 Trochanteric bursitis, right hip: Secondary | ICD-10-CM | POA: Diagnosis not present

## 2021-05-16 DIAGNOSIS — M1711 Unilateral primary osteoarthritis, right knee: Secondary | ICD-10-CM | POA: Diagnosis not present

## 2021-05-20 DIAGNOSIS — M1711 Unilateral primary osteoarthritis, right knee: Secondary | ICD-10-CM | POA: Diagnosis not present

## 2021-05-20 DIAGNOSIS — M25561 Pain in right knee: Secondary | ICD-10-CM | POA: Diagnosis not present

## 2021-05-20 DIAGNOSIS — M7061 Trochanteric bursitis, right hip: Secondary | ICD-10-CM | POA: Diagnosis not present

## 2021-05-22 DIAGNOSIS — H02885 Meibomian gland dysfunction left lower eyelid: Secondary | ICD-10-CM | POA: Diagnosis not present

## 2021-05-22 DIAGNOSIS — H02822 Cysts of right lower eyelid: Secondary | ICD-10-CM | POA: Diagnosis not present

## 2021-05-24 DIAGNOSIS — M1711 Unilateral primary osteoarthritis, right knee: Secondary | ICD-10-CM | POA: Diagnosis not present

## 2021-05-24 DIAGNOSIS — M25561 Pain in right knee: Secondary | ICD-10-CM | POA: Diagnosis not present

## 2021-05-24 DIAGNOSIS — M7061 Trochanteric bursitis, right hip: Secondary | ICD-10-CM | POA: Diagnosis not present

## 2021-05-27 DIAGNOSIS — M7061 Trochanteric bursitis, right hip: Secondary | ICD-10-CM | POA: Diagnosis not present

## 2021-05-27 DIAGNOSIS — M25561 Pain in right knee: Secondary | ICD-10-CM | POA: Diagnosis not present

## 2021-05-27 DIAGNOSIS — M1711 Unilateral primary osteoarthritis, right knee: Secondary | ICD-10-CM | POA: Diagnosis not present

## 2021-05-29 DIAGNOSIS — M25561 Pain in right knee: Secondary | ICD-10-CM | POA: Diagnosis not present

## 2021-05-29 DIAGNOSIS — M7061 Trochanteric bursitis, right hip: Secondary | ICD-10-CM | POA: Diagnosis not present

## 2021-05-29 DIAGNOSIS — M1711 Unilateral primary osteoarthritis, right knee: Secondary | ICD-10-CM | POA: Diagnosis not present

## 2021-06-10 DIAGNOSIS — M9903 Segmental and somatic dysfunction of lumbar region: Secondary | ICD-10-CM | POA: Diagnosis not present

## 2021-06-10 DIAGNOSIS — M9905 Segmental and somatic dysfunction of pelvic region: Secondary | ICD-10-CM | POA: Diagnosis not present

## 2021-06-10 DIAGNOSIS — M9904 Segmental and somatic dysfunction of sacral region: Secondary | ICD-10-CM | POA: Diagnosis not present

## 2021-06-10 DIAGNOSIS — M5136 Other intervertebral disc degeneration, lumbar region: Secondary | ICD-10-CM | POA: Diagnosis not present

## 2021-06-14 DIAGNOSIS — I872 Venous insufficiency (chronic) (peripheral): Secondary | ICD-10-CM | POA: Diagnosis not present

## 2021-06-14 DIAGNOSIS — E7800 Pure hypercholesterolemia, unspecified: Secondary | ICD-10-CM | POA: Diagnosis not present

## 2021-06-14 DIAGNOSIS — I10 Essential (primary) hypertension: Secondary | ICD-10-CM | POA: Diagnosis not present

## 2021-06-14 DIAGNOSIS — M7989 Other specified soft tissue disorders: Secondary | ICD-10-CM | POA: Diagnosis not present

## 2021-06-17 DIAGNOSIS — M5136 Other intervertebral disc degeneration, lumbar region: Secondary | ICD-10-CM | POA: Diagnosis not present

## 2021-06-17 DIAGNOSIS — M9905 Segmental and somatic dysfunction of pelvic region: Secondary | ICD-10-CM | POA: Diagnosis not present

## 2021-06-17 DIAGNOSIS — M9904 Segmental and somatic dysfunction of sacral region: Secondary | ICD-10-CM | POA: Diagnosis not present

## 2021-06-17 DIAGNOSIS — M9903 Segmental and somatic dysfunction of lumbar region: Secondary | ICD-10-CM | POA: Diagnosis not present

## 2021-08-11 ENCOUNTER — Other Ambulatory Visit: Payer: Self-pay | Admitting: Endocrinology

## 2021-10-04 ENCOUNTER — Other Ambulatory Visit: Payer: Self-pay

## 2021-10-04 ENCOUNTER — Other Ambulatory Visit (INDEPENDENT_AMBULATORY_CARE_PROVIDER_SITE_OTHER): Payer: Medicare Other

## 2021-10-04 DIAGNOSIS — E063 Autoimmune thyroiditis: Secondary | ICD-10-CM

## 2021-10-08 LAB — T4, FREE: Free T4: 0.9 ng/dL (ref 0.60–1.60)

## 2021-10-08 LAB — TSH: TSH: 3.65 u[IU]/mL (ref 0.35–5.50)

## 2021-10-09 ENCOUNTER — Ambulatory Visit (INDEPENDENT_AMBULATORY_CARE_PROVIDER_SITE_OTHER): Payer: Medicare Other | Admitting: Endocrinology

## 2021-10-09 ENCOUNTER — Other Ambulatory Visit: Payer: Self-pay

## 2021-10-09 ENCOUNTER — Encounter: Payer: Self-pay | Admitting: Endocrinology

## 2021-10-09 VITALS — BP 140/78 | HR 59 | Ht 61.5 in | Wt 140.8 lb

## 2021-10-09 DIAGNOSIS — E063 Autoimmune thyroiditis: Secondary | ICD-10-CM | POA: Diagnosis not present

## 2021-10-09 NOTE — Progress Notes (Signed)
Patient ID: Jasmine Velez, female   DOB: Apr 17, 1941, 81 y.o.   MRN: [id number] ? ?       ? ? ? ? ?Reason for Appointment:  Hypothyroidism, follow-up visit ? ? ? History of Present Illness:  ? ?Hypothyroidism was first diagnosed in 09/2018 ? ?At the time of diagnosis patient was having routine lab work and TSH was 6.7 ?She did not have at that time any symptoms of fatigue, cold sensitivity or weight change ?Not clear if she had abnormal thyroid levels prior to that          ? ?The patient has been treated with both levothyroxine and Armour Thyroid ?When she first started supplementation in 3/20 she was tried on unknown dose of levothyroxine but she thinks it caused her to have more reflux symptoms and she was switched to Armour Thyroid 15 mg daily   ? ?With starting thyroid supplementation she did not feel any different ? ?Subsequently her TSH was monitored periodically and ranged between 5.7 and 6.0 in 2020 ?In 10/2019 she was seen by a different healthcare professional and even though her TSH was only 4.1 she was told to go up to 30 mg of Armour Thyroid ?Did not again feel any different with the dosage change ?Subsequent TSH 2 months later in 12/2019 was 2.8 ? ?In late summer 2021 she thinks she was starting to have more fatigue overall and continued until this month ?However she did not complain of any palpitations, shakiness, unusual weight loss or heat intolerance ? ?She had an episode of generalized rash on 06/14/2020 which was treated by dermatologist as allergic dermatitis ?She was told to stop her Armour Thyroid on 06/20/2020 when her TSH was 0.01 ?TSH done on 07/02/20 was reportedly 3.6 ? ?RECENT history: ? ?At her initial consultation in 06/2020 she was told not to take any thyroid supplements since she was felt to have subclinical hypothyroidism with 50 mcg of Armour Thyroid ?She came back in 5/22 since she was having more symptoms of feeling more fatigued and needing to take a nap in the afternoon as well as  being more sensitive to cold, also noticing some forgetfulness ? ?Since she was symptomatic and her TSH was higher at 5.8 she was started back on Armour Thyroid 15 mg daily ?With this she started feeling better and the dosage has been continued unchanged ? ?More recently she tends to get tired or sleepy at some time during the day requiring a nap ?This has been going on intermittently before also, recently also had more stress ?She wakes up feeling refreshed in the morning usually ?However she is able to do her routine activities and walking for exercise ? ?Tends to have mild cold intolerance ? ?She has been regular with her Armour Thyroid regularly on empty stomach in the mornings ? ?TSH which was about 5 in 9/22 is normal now ?        ?Patient's weight history is as follows: ? ?Wt Readings from Last 3 Encounters:  ?10/09/21 140 lb 12.8 oz (63.9 kg)  ?04/11/21 142 lb 9.6 oz (64.7 kg)  ?01/10/21 142 lb (64.4 kg)  ? ? ?Thyroid function results have been as follows: ? ?Lab Results  ?Component Value Date  ? TSH 3.65 10/04/2021  ? TSH 5.04 04/08/2021  ? TSH 4.17 01/07/2021  ? TSH 5.84 (H) 11/26/2020  ? FREET4 0.90 10/04/2021  ? FREET4 0.88 04/08/2021  ? FREET4 0.89 01/07/2021  ? FREET4 0.76 11/26/2020  ? T3FREE 3.2  04/08/2021  ? T3FREE 3.6 01/07/2021  ? ? ? ?Past Medical History:  ?Diagnosis Date  ? Allergy   ? sinus issues with weather changes   ? Arthritis   ? 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 Asthma   ? Cancer [facility])   ? colon cancer   ? Cataract   ? removed both eyes   ? GERD (gastroesophageal reflux disease)   ? Hyperlipidemia   ? PUD (peptic ulcer disease)   ? 25 yrs ago   ? Sciatica   ? UTI (lower urinary tract infection)   ? ? ?Past Surgical History:  ?Procedure Laterality Date  ? CATARACT EXTRACTION  2013, 2014  ? COLON RESECTION  2008  ? COLONOSCOPY    ? LASIK  2000  ? NASAL SEPTUM SURGERY  1980's  ? PILONIDAL CYST EXCISION  1960  ? TONSILLECTOMY  1960  ? TOTAL HIP ARTHROPLASTY  1994  ? ? ?Family History  ?Problem  Relation Age of Onset  ? CAD Father   ? Dementia Mother   ? Dementia Sister   ? Colon polyps Neg Hx   ? Colon cancer Neg Hx   ? Rectal cancer Neg Hx   ? Stomach cancer Neg Hx   ? Thyroid disease Neg Hx   ? Esophageal cancer Neg Hx   ? Liver disease Neg Hx   ? Pancreatic cancer Neg Hx   ? ? ?Social History:  reports that she has never smoked. She has never used smokeless tobacco. She reports current alcohol use. She reports that she does not use drugs. ? ?Allergies:  ?Allergies  ?Allergen Reactions  ? Other Other (See Comments)  ?  Green, yellow, orange bell peppers causes severe gastric distress   ? Amlodipine Nausea Only  ? Ceclor [Cefaclor] Nausea Only  ?  Jerrye Bushy  ? Nexium [Esomeprazole Magnesium] Nausea Only  ?  sick  ? Statins   ?  Muscle pain, nausea, reflux  ? Tramadol Nausea And Vomiting  ? Erythromycin Rash  ?  In mouth  ? Penicillins Rash  ?  In her mouth  ? Sulfa Antibiotics Rash  ? ? ?Allergies as of 10/09/2021   ? ?   Reactions  ? Other Other (See Comments)  ? Green, yellow, orange bell peppers causes severe gastric distress   ? Amlodipine Nausea Only  ? Ceclor [cefaclor] Nausea Only  ? Jerrye Bushy  ? Nexium [esomeprazole Magnesium] Nausea Only  ? sick  ? Statins   ? Muscle pain, nausea, reflux  ? Tramadol Nausea And Vomiting  ? Erythromycin Rash  ? In mouth  ? Penicillins Rash  ? In her mouth  ? Sulfa Antibiotics Rash  ? ?  ? ?  ?Medication List  ?  ? ?  ? Accurate as of October 09, 2021  9:15 AM. If you have any questions, ask your nurse or doctor.  ?  ?  ? ?  ? ?albuterol 108 (90 Base) MCG/ACT inhaler ?Commonly known as: ProAir HFA ?Inhale 1-2 puffs into the lungs every 6 (six) hours as needed for wheezing or shortness of breath. ?  ?Armour Thyroid 15 MG tablet ?Generic drug: thyroid ?TAKE 1 TABLET (15 MG TOTAL) BY MOUTH DAILY. ?  ?budesonide 180 MCG/ACT inhaler ?Commonly known as: PULMICORT ?Inhale 1 puff 2 (two) times daily into the lungs. ?  ?calcium-vitamin D 500-200 MG-UNIT tablet ?Commonly known as: OSCAL  WITH D ?Take 1 tablet by mouth daily. ?  ?cholecalciferol 25 MCG (1000 UNIT) tablet ?Commonly  known as: VITAMIN D ?2 tablets ?  ?clobetasol cream 0.05 % ?Commonly known as: TEMOVATE ?Apply topically 2 (two) times daily. ?  ?fluticasone 50 MCG/ACT nasal spray ?Commonly known as: FLONASE ?Place 2 sprays into both nostrils as needed for allergies or rhinitis. ?  ?hydrochlorothiazide 25 MG tablet ?Commonly known as: HYDRODIURIL ?Take 25 mg by mouth daily as needed. ?  ?hydrocortisone 2.5 % cream ?1 application ?  ?LUTEIN PO ?One every other day ?  ?metroNIDAZOLE 0.75 % gel ?Commonly known as: METROGEL ?Apply topically 2 (two) times daily. ?  ?multivitamin with minerals Tabs tablet ?Take 1 tablet by mouth daily. ?  ?nystatin cream ?Commonly known as: MYCOSTATIN ?Apply 1 application topically as needed. ?  ?pravastatin 20 MG tablet ?Commonly known as: PRAVACHOL ?Take 40 mg by mouth every other day. ?  ?RABEprazole 20 MG tablet ?Commonly known as: ACIPHEX ?Take 20 mg by mouth as needed. ?  ? ?  ? ? ?  ? ?Review of Systems ? ?She is taking HydroDIURIL as needed for edema of legs ?     ? ?      ? Examination: ?  ? BP 140/78   Pulse (!) 59   Ht 5' 1.5" (1.562 m)   Wt 140 lb 12.8 oz (63.9 kg)   SpO2 98%   BMI 26.17 kg/m?  ? ? ? ?Assessment: ? ?HYPOTHYROIDISM, mild with highest TSH 6.7, symptomatic ?No goiter by history ? ?She has subjectively done well with 15 mg Armour Thyroid regularly ?Note she reports GI intolerance to levothyroxine ? ?Although her TSH was trending slightly higher in the upper normal range previously it is back down to 3.6 now ?Does not appear to have any progression of her mild hypothyroidism ? ?Her daytime somnolence is unrelated to hypothyroidism and she needs to discuss with her PCP ? ?PLAN:  ? ?Continue same dose of 15 mg Armour Thyroid and follow-up in 6 months ?If she has any unusual or persistent fatigue she can come back sooner ? ? ?Elayne Snare ?10/09/2021, 9:15 AM  ? ? ?Note: This office note  was prepared with Estate agent. Any transcriptional errors that result from this process are unintentional. ? ?

## 2021-11-13 DIAGNOSIS — R252 Cramp and spasm: Secondary | ICD-10-CM | POA: Diagnosis not present

## 2021-11-13 DIAGNOSIS — R5383 Other fatigue: Secondary | ICD-10-CM | POA: Diagnosis not present

## 2021-11-13 DIAGNOSIS — R42 Dizziness and giddiness: Secondary | ICD-10-CM | POA: Diagnosis not present

## 2021-11-20 ENCOUNTER — Other Ambulatory Visit: Payer: Self-pay | Admitting: Internal Medicine

## 2021-11-30 IMAGING — MG MM DIGITAL SCREENING BILAT W/ TOMO AND CAD
8 series · 9 of 24 positions shown · non-contrast
Comparison: Previous exam(s).

CLINICAL DATA: Screening.

EXAM:
DIGITAL SCREENING BILATERAL MAMMOGRAM WITH TOMOSYNTHESIS AND CAD
TECHNIQUE: Bilateral screening digital craniocaudal and mediolateral oblique
mammograms were obtained. Bilateral screening digital breast
tomosynthesis was performed. The images were evaluated with
computer-aided detection.

[R MLO synth-2D]
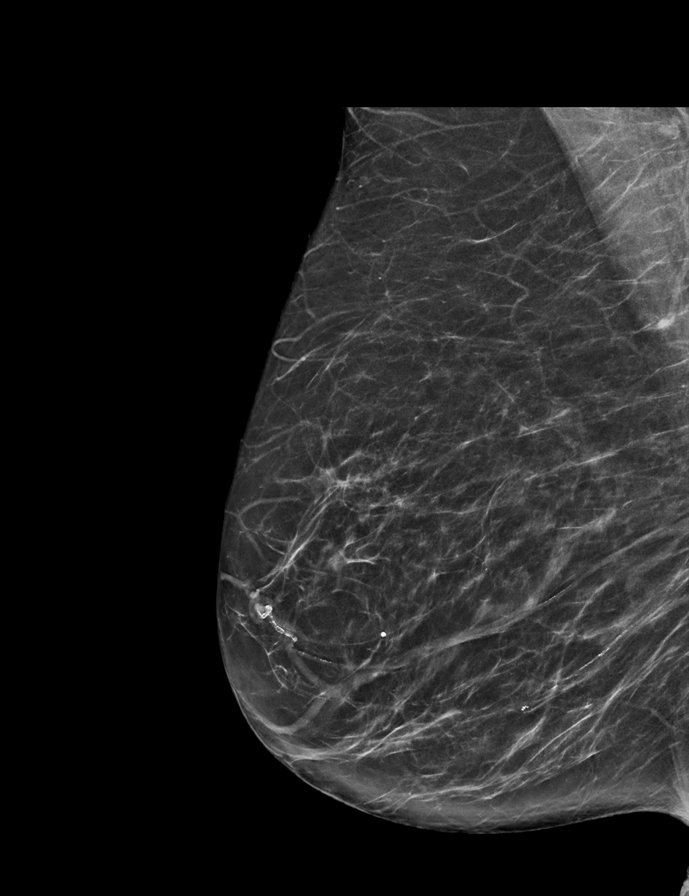

[R CC synth-2D]
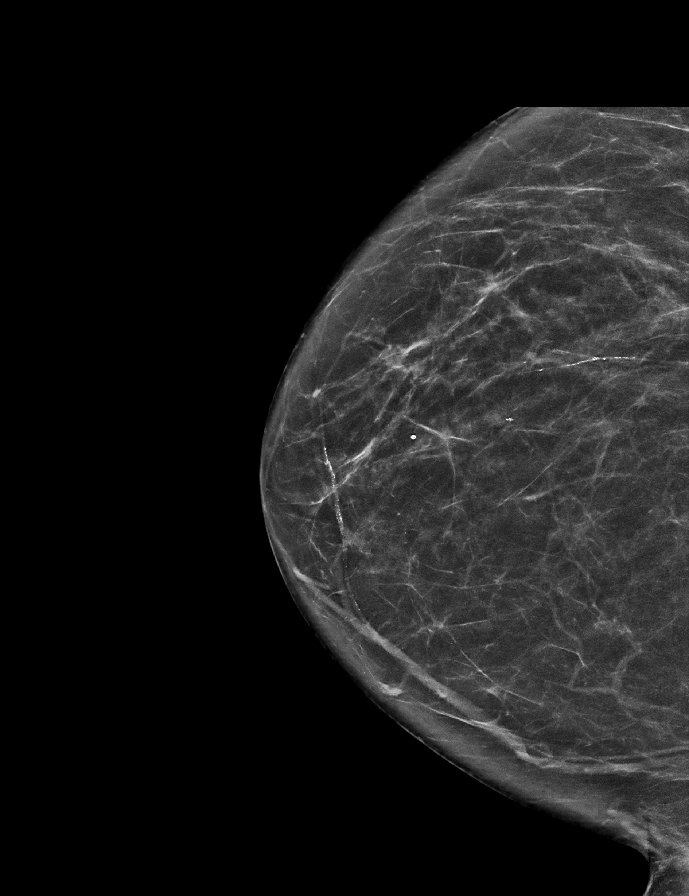

[L MLO synth-2D]
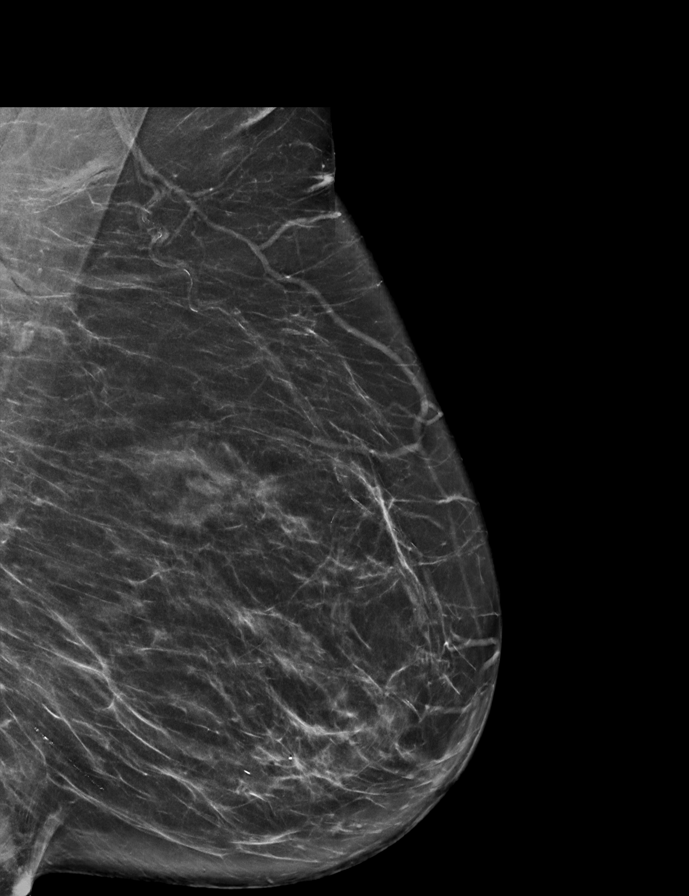

[L CC synth-2D]
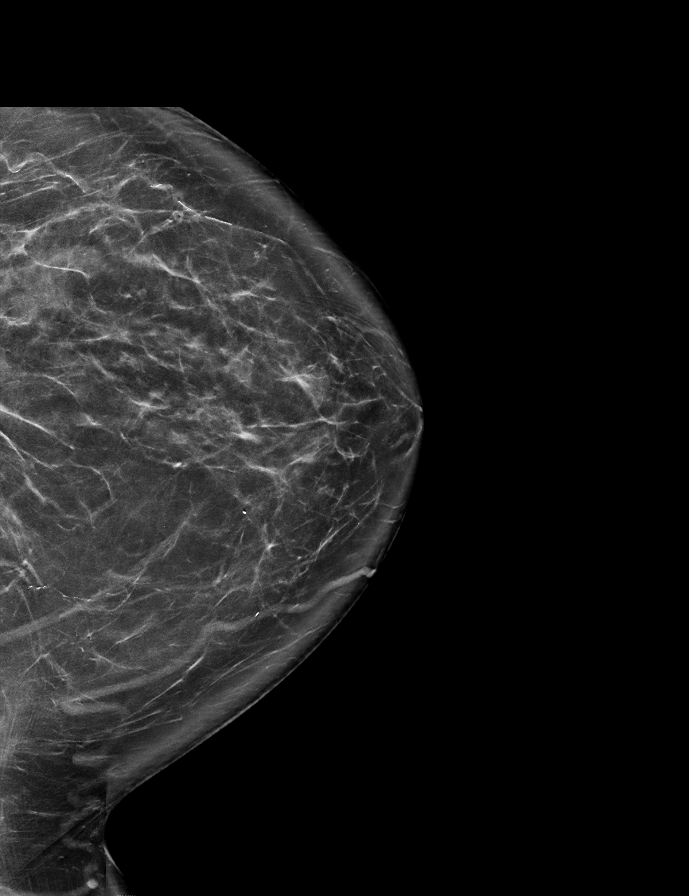

[R MLO tomo · 2 of 67 frames shown]
[frame 22/67]
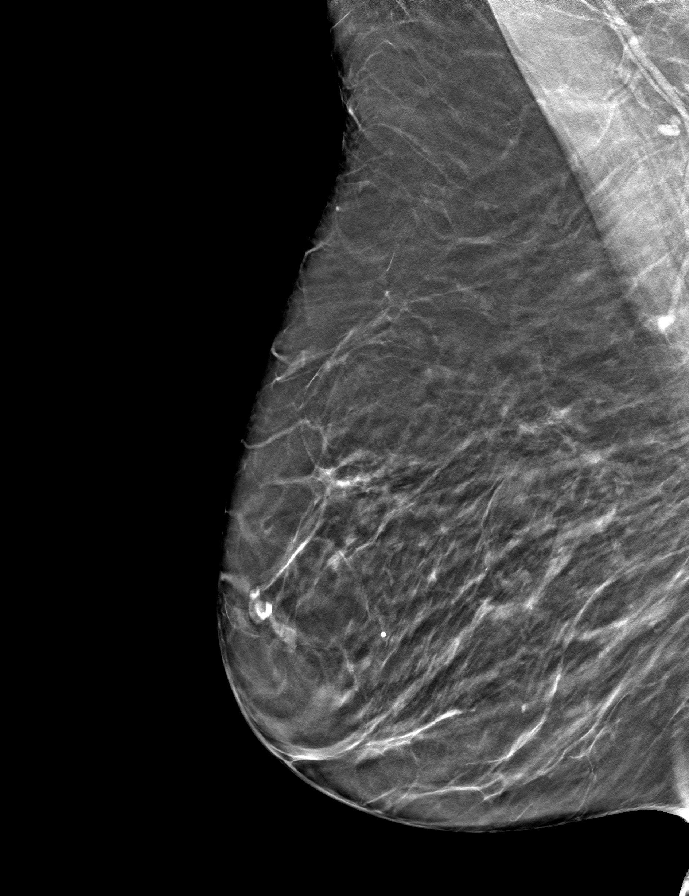
[frame 34/67]
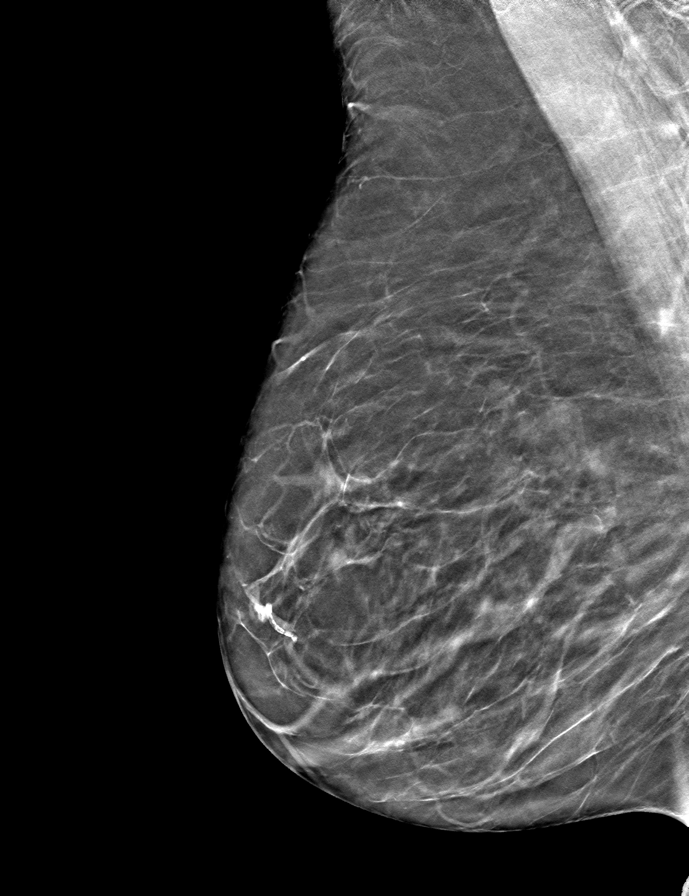

[L CC tomo · tomo slice 41/80.0]
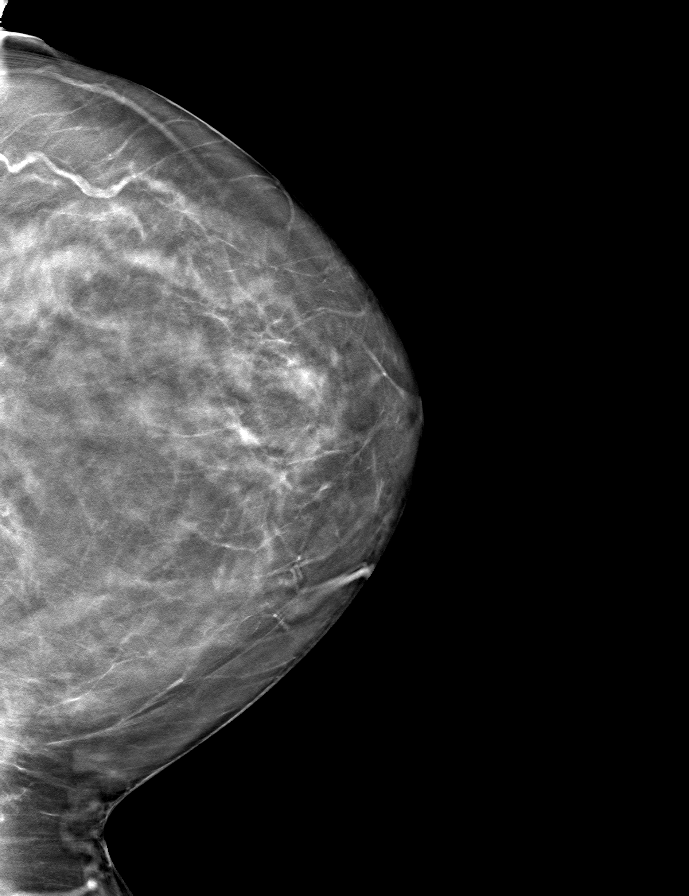

[L MLO tomo · tomo slice 39/76.0]
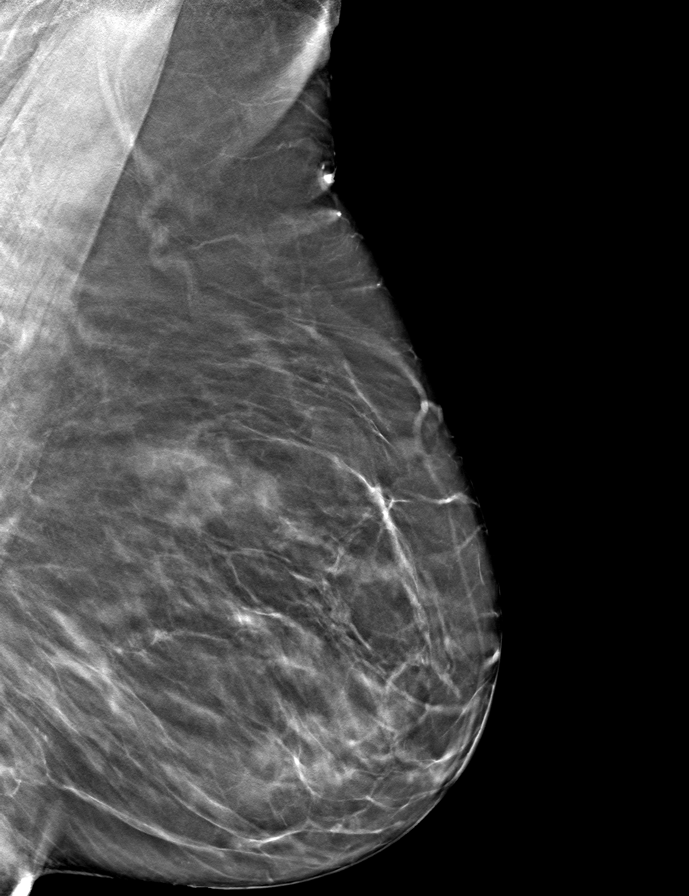

[R CC tomo · tomo slice 33/64.0]
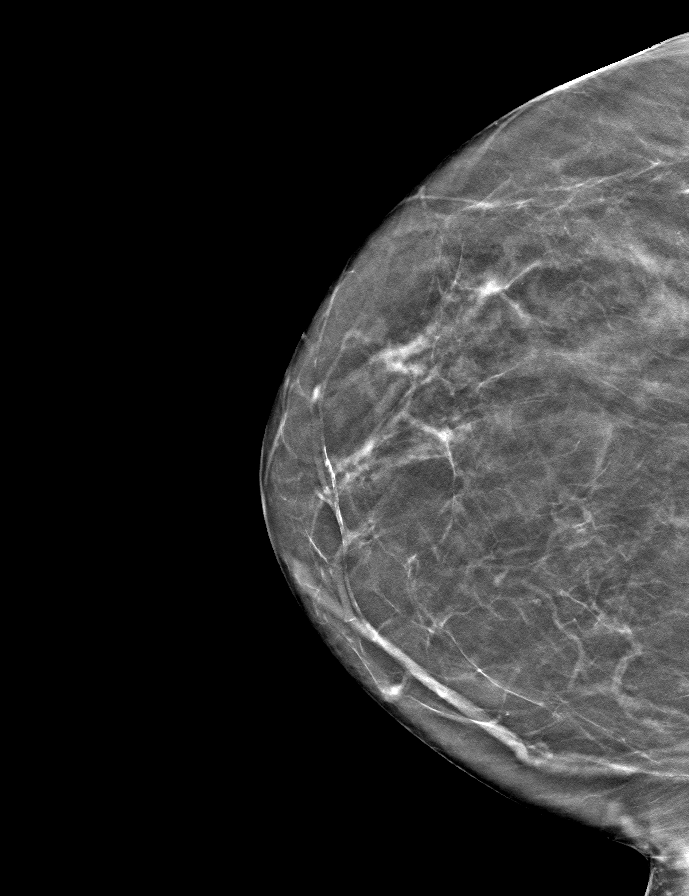

[9 of 24 positions shown; findings below may reference images not displayed]

ACR Breast Density Category b: There are scattered areas of
fibroglandular density.
FINDINGS: There are no findings suspicious for malignancy.
IMPRESSION: No mammographic evidence of malignancy. A result letter of this
screening mammogram will be mailed directly to the patient.

RECOMMENDATION:
Screening mammogram in one year. (Code:51-O-LD2)

BI-RADS CATEGORY  1: Negative.

## 2021-12-05 DIAGNOSIS — M7062 Trochanteric bursitis, left hip: Secondary | ICD-10-CM | POA: Diagnosis not present

## 2021-12-05 DIAGNOSIS — M25552 Pain in left hip: Secondary | ICD-10-CM | POA: Diagnosis not present

## 2021-12-17 ENCOUNTER — Other Ambulatory Visit: Payer: Self-pay | Admitting: Internal Medicine

## 2021-12-17 DIAGNOSIS — Z0000 Encounter for general adult medical examination without abnormal findings: Secondary | ICD-10-CM | POA: Diagnosis not present

## 2021-12-17 DIAGNOSIS — M8589 Other specified disorders of bone density and structure, multiple sites: Secondary | ICD-10-CM | POA: Diagnosis not present

## 2021-12-17 DIAGNOSIS — I10 Essential (primary) hypertension: Secondary | ICD-10-CM | POA: Diagnosis not present

## 2021-12-17 DIAGNOSIS — E7800 Pure hypercholesterolemia, unspecified: Secondary | ICD-10-CM | POA: Diagnosis not present

## 2021-12-17 DIAGNOSIS — J4540 Moderate persistent asthma, uncomplicated: Secondary | ICD-10-CM | POA: Diagnosis not present

## 2021-12-17 DIAGNOSIS — K219 Gastro-esophageal reflux disease without esophagitis: Secondary | ICD-10-CM | POA: Diagnosis not present

## 2021-12-20 ENCOUNTER — Other Ambulatory Visit: Payer: Self-pay | Admitting: Family Medicine

## 2021-12-20 DIAGNOSIS — E2839 Other primary ovarian failure: Secondary | ICD-10-CM

## 2021-12-24 ENCOUNTER — Other Ambulatory Visit: Payer: Self-pay | Admitting: Family Medicine

## 2021-12-24 DIAGNOSIS — Z1231 Encounter for screening mammogram for malignant neoplasm of breast: Secondary | ICD-10-CM

## 2021-12-27 ENCOUNTER — Other Ambulatory Visit: Payer: Self-pay | Admitting: Internal Medicine

## 2021-12-30 ENCOUNTER — Encounter (INDEPENDENT_AMBULATORY_CARE_PROVIDER_SITE_OTHER): Payer: Medicare Other | Admitting: Ophthalmology

## 2021-12-31 DIAGNOSIS — M7062 Trochanteric bursitis, left hip: Secondary | ICD-10-CM | POA: Diagnosis not present

## 2022-01-01 ENCOUNTER — Other Ambulatory Visit: Payer: Self-pay | Admitting: Internal Medicine

## 2022-01-08 ENCOUNTER — Encounter: Payer: Self-pay | Admitting: Nurse Practitioner

## 2022-01-08 ENCOUNTER — Ambulatory Visit (INDEPENDENT_AMBULATORY_CARE_PROVIDER_SITE_OTHER): Payer: Medicare Other | Admitting: Nurse Practitioner

## 2022-01-08 DIAGNOSIS — J4530 Mild persistent asthma, uncomplicated: Secondary | ICD-10-CM

## 2022-01-08 NOTE — Progress Notes (Signed)
$'@Patient'Q$  ID: Jasmine Velez, female    DOB: [birth year], 81 y.o.   MRN: [id number]  Chief Complaint  Patient presents with   Follow-up    Referring provider: Glenis Smoker, *  HPI: 81 year old female, never smoker followed for mild persistent asthma.  She is a patient Dr. Golden Pop and last seen in office on 11/16/2019.  Past medical history significant for hypothyroidism, HLD, hypertension.  TEST/EVENTS:  08/11/2014: FVC93, FEV1 80, ratio 65  11/16/2019: OV with Dr. Chase Caller.  Patient doing well overall.  Albuterol use is essentially nonexistent.  She did have some recent stressful events with the 14th anniversary of her first husband and 10th anniversary of meeting her late significant other but was feeling better.  Continued Pulmicort 1 puff twice daily and albuterol as needed.  01/08/2022: Today-follow-up Patient presents today for overdue follow-up.  She has been feeling well since we saw her 2 years ago.  Breathing has been overall stable.  She has used her albuterol 1 or 2 times this month.  She has been dealing with some stressful life events as they just found out that her significant other has been diagnosed with bladder cancer and has been undergoing multiple different procedures.  She attributes this to her intermittent episodes, which has been a trigger for her in the past as well.  Otherwise, she has felt as though her breathing has been doing great.  She has not had any exacerbations requiring a visit to the ED or urgent care.  She has not been on any prednisone or antibiotics in the last year.  She continues on Pulmicort twice daily.   Allergies  Allergen Reactions   Other Other (See Comments)    ALL peppers causes severe gastric distress per patient.  Paprika.    Tramadol Nausea And Vomiting   Amlodipine Nausea Only   Ceclor [Cefaclor] Nausea Only    Gerd   Erythromycin Rash    In mouth   Nexium [Esomeprazole Magnesium] Nausea Only    sick   Penicillins Rash     In her mouth   Statins Other (See Comments)    Muscle pain, nausea, reflux   Sulfa Antibiotics Rash    Immunization History  Administered Date(s) Administered   Influenza Split 04/13/2014   Influenza, High Dose Seasonal PF 05/11/2017, 05/14/2018, 04/09/2019, 03/28/2021   Influenza,inj,Quad PF,6+ Mos 04/14/2015   PFIZER(Purple Top)SARS-COV-2 Vaccination 08/04/2019, 08/25/2019, 04/03/2020, 10/13/2020   PNEUMOCOCCAL CONJUGATE-20 12/07/2020   Pfizer Covid-19 Vaccine Bivalent Booster 14yr & up 03/28/2021   Pneumococcal Conjugate-13 08/11/2014   Pneumococcal Polysaccharide-23 05/18/2018   Td 12/05/2015    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 1 Inhaler 5   ARMOUR THYROID 15 MG tablet TAKE 1 TABLET (15 MG TOTAL) BY MOUTH DAILY. 90 tablet 1   budesonide (PULMICORT) 180 MCG/ACT inhaler Inhale 1 puff 2 (two) times daily into the lungs. 1 Inhaler 3   cholecalciferol (VITAMIN D) 25 MCG (1000 UNIT) tablet 2 tablets     clobetasol cream (TEMOVATE) 0.05 % Apply topically 2 (two) times daily.     fluticasone (FLONASE) 50 MCG/ACT nasal spray SPRAY 2 SPRAYS INTO EACH NOSTRIL EVERY DAY 16 mL 0   hydrochlorothiazide (HYDRODIURIL) 25 MG tablet Take 25 mg by mouth daily as needed.     hydrocortisone 2.5 % cream 1  application     metroNIDAZOLE (METROGEL) 0.75 % gel Apply topically 2 (two) times daily.     Multiple Vitamin (MULTIVITAMIN WITH MINERALS) TABS tablet Take 1 tablet by mouth daily.     nystatin cream (MYCOSTATIN) Apply 1 application topically as needed.     pravastatin (PRAVACHOL) 20 MG tablet Take 40 mg by mouth every other day.   12   RABEprazole (ACIPHEX) 20 MG tablet Take 20 mg by mouth as needed.      LUTEIN PO One every other day     No facility-administered medications prior to visit.     Review of Systems:   Constitutional: No weight loss or gain, night sweats, fevers, chills, fatigue, or lassitude. HEENT: No headaches, difficulty swallowing, tooth/dental problems, or sore throat. No sneezing, itching, ear ache, nasal congestion, or post nasal drip CV:  No chest pain, orthopnea, PND, swelling in lower extremities, anasarca, dizziness, palpitations, syncope Resp: No shortness of breath with exertion or at rest. No excess mucus or change in color of mucus. No productive or non-productive. No hemoptysis. No wheezing.  No chest wall deformity Skin: No rash, lesions, ulcerations MSK:  No joint pain or swelling.  No decreased range of motion.  No back pain. Neuro: No dizziness or lightheadedness.  Psych: No depression or anxiety. Mood stable.     Physical Exam:  BP 120/70 (BP Location: Right Arm, Cuff Size: Normal)   Pulse 74   Temp 98.4 F (36.9 C) (Oral)   Ht 5' 1.5" (1.562 m)   Wt 142 lb 12.8 oz (64.8 kg)   SpO2 96%   BMI 26.54 kg/m   GEN: Pleasant, interactive, well-appearing; in no acute distress. HEENT:  Normocephalic and atraumatic. PERRLA. Sclera white. Nasal turbinates pink, moist and patent bilaterally. No rhinorrhea present. Oropharynx pink and moist, without exudate or edema. No lesions, ulcerations, or postnasal drip.  NECK:  Supple w/ fair ROM.  CV: RRR, no m/r/g, no peripheral edema. Pulses intact, +2 bilaterally. No cyanosis, pallor or clubbing. PULMONARY:   Unlabored, regular breathing. Clear bilaterally A&P w/o wheezes/rales/rhonchi. No accessory muscle use. No dullness to percussion. GI: BS present and normoactive. Soft, non-tender to palpation.  MSK: No erythema, warmth or tenderness. Cap refil <2 sec all extrem. No deformities or joint swelling noted.  Neuro: A/Ox3. No focal deficits noted.   Skin: Warm, no lesions or rashe Psych: Normal affect and behavior. Judgement and thought content appropriate.     Lab Results:  CBC No results found for: "WBC", "RBC", "HGB", "HCT", "PLT", "MCV", "MCH", "MCHC", "RDW", "LYMPHSABS", "MONOABS", "EOSABS", "BASOSABS"  BMET No results found for: "NA", "K", "CL", "CO2", "GLUCOSE", "BUN", "CREATININE", "CALCIUM", "GFRNONAA", "GFRAA"  BNP No results found for: "BNP"   Imaging:  No results found.        No data to display  Lab Results  Component Value Date   NITRICOXIDE 17 05/18/2018        Assessment & Plan:   Mild persistent asthma Well-controlled on current regimen.  She has had some recent stressful events which I agree could be the contributing cause of her increased shortness of breath in those times.  They were quickly relieved with albuterol use.  She has only had to use it once or twice this month.  Advised that she monitor her usage and notify of any increasing requirements.  Continue ICS inhaler and as needed albuterol.  Patient Instructions  Continue pulmicort 1 puff Twice daily. Brush tongue and rinse mouth afterwards Continue Albuterol inhaler 2 puffs every 6 hours as needed for shortness of breath or wheezing. Notify if symptoms persist despite rescue inhaler/neb use. Continue flonase 2 sprays each nostril daily   Follow up with Dr. Chase Caller in one year. If symptoms do not improve or worsen, please contact office for sooner follow up or seek emergency care.    I spent 25 minutes of dedicated to the care of this patient on the date of this encounter to  include pre-visit review of records, face-to-face time with the patient discussing conditions above, post visit ordering of testing, clinical documentation with the electronic health record, making appropriate referrals as documented, and communicating necessary findings to members of the patients care team.  Clayton Bibles, NP 01/08/2022  Pt aware and understands NP's role.

## 2022-01-08 NOTE — Assessment & Plan Note (Signed)
Well-controlled on current regimen.  She has had some recent stressful events which I agree could be the contributing cause of her increased shortness of breath in those times.  They were quickly relieved with albuterol use.  She has only had to use it once or twice this month.  Advised that she monitor her usage and notify of any increasing requirements.  Continue ICS inhaler and as needed albuterol.  Patient Instructions  Continue pulmicort 1 puff Twice daily. Brush tongue and rinse mouth afterwards Continue Albuterol inhaler 2 puffs every 6 hours as needed for shortness of breath or wheezing. Notify if symptoms persist despite rescue inhaler/neb use. Continue flonase 2 sprays each nostril daily   Follow up with Dr. Chase Caller in one year. If symptoms do not improve or worsen, please contact office for sooner follow up or seek emergency care.

## 2022-01-08 NOTE — Patient Instructions (Signed)
Continue pulmicort 1 puff Twice daily. Brush tongue and rinse mouth afterwards Continue Albuterol inhaler 2 puffs every 6 hours as needed for shortness of breath or wheezing. Notify if symptoms persist despite rescue inhaler/neb use. Continue flonase 2 sprays each nostril daily   Follow up with Dr. Chase Caller in one year. If symptoms do not improve or worsen, please contact office for sooner follow up or seek emergency care.

## 2022-01-10 ENCOUNTER — Ambulatory Visit: Payer: Medicare Other

## 2022-01-13 ENCOUNTER — Encounter (INDEPENDENT_AMBULATORY_CARE_PROVIDER_SITE_OTHER): Payer: Medicare Other | Admitting: Ophthalmology

## 2022-01-13 DIAGNOSIS — H43813 Vitreous degeneration, bilateral: Secondary | ICD-10-CM | POA: Diagnosis not present

## 2022-01-13 DIAGNOSIS — H353132 Nonexudative age-related macular degeneration, bilateral, intermediate dry stage: Secondary | ICD-10-CM

## 2022-01-22 ENCOUNTER — Ambulatory Visit
Admission: RE | Admit: 2022-01-22 | Discharge: 2022-01-22 | Disposition: A | Discharge status: Home / Self Care | Payer: Medicare Other | Source: Ambulatory Visit | Attending: Family Medicine | Admitting: Family Medicine

## 2022-01-22 DIAGNOSIS — Z1231 Encounter for screening mammogram for malignant neoplasm of breast: Secondary | ICD-10-CM

## 2022-01-23 DIAGNOSIS — M5442 Lumbago with sciatica, left side: Secondary | ICD-10-CM | POA: Diagnosis not present

## 2022-01-23 DIAGNOSIS — M4186 Other forms of scoliosis, lumbar region: Secondary | ICD-10-CM | POA: Diagnosis not present

## 2022-01-23 DIAGNOSIS — M7918 Myalgia, other site: Secondary | ICD-10-CM | POA: Diagnosis not present

## 2022-01-28 ENCOUNTER — Other Ambulatory Visit: Payer: Self-pay | Admitting: Internal Medicine

## 2022-01-28 DIAGNOSIS — R531 Weakness: Secondary | ICD-10-CM | POA: Diagnosis not present

## 2022-01-28 DIAGNOSIS — M419 Scoliosis, unspecified: Secondary | ICD-10-CM | POA: Diagnosis not present

## 2022-01-30 DIAGNOSIS — R531 Weakness: Secondary | ICD-10-CM | POA: Diagnosis not present

## 2022-01-30 DIAGNOSIS — M419 Scoliosis, unspecified: Secondary | ICD-10-CM | POA: Diagnosis not present

## 2022-01-31 DIAGNOSIS — L309 Dermatitis, unspecified: Secondary | ICD-10-CM | POA: Diagnosis not present

## 2022-01-31 DIAGNOSIS — L219 Seborrheic dermatitis, unspecified: Secondary | ICD-10-CM | POA: Diagnosis not present

## 2022-02-03 DIAGNOSIS — M419 Scoliosis, unspecified: Secondary | ICD-10-CM | POA: Diagnosis not present

## 2022-02-03 DIAGNOSIS — R531 Weakness: Secondary | ICD-10-CM | POA: Diagnosis not present

## 2022-02-06 DIAGNOSIS — M419 Scoliosis, unspecified: Secondary | ICD-10-CM | POA: Diagnosis not present

## 2022-02-06 DIAGNOSIS — R531 Weakness: Secondary | ICD-10-CM | POA: Diagnosis not present

## 2022-02-12 DIAGNOSIS — M419 Scoliosis, unspecified: Secondary | ICD-10-CM | POA: Diagnosis not present

## 2022-02-12 DIAGNOSIS — R531 Weakness: Secondary | ICD-10-CM | POA: Diagnosis not present

## 2022-02-14 DIAGNOSIS — R531 Weakness: Secondary | ICD-10-CM | POA: Diagnosis not present

## 2022-02-14 DIAGNOSIS — M419 Scoliosis, unspecified: Secondary | ICD-10-CM | POA: Diagnosis not present

## 2022-02-17 ENCOUNTER — Other Ambulatory Visit: Payer: Self-pay | Admitting: Endocrinology

## 2022-02-17 DIAGNOSIS — M419 Scoliosis, unspecified: Secondary | ICD-10-CM | POA: Diagnosis not present

## 2022-02-17 DIAGNOSIS — R531 Weakness: Secondary | ICD-10-CM | POA: Diagnosis not present

## 2022-03-23 ENCOUNTER — Encounter (HOSPITAL_COMMUNITY): Payer: Self-pay | Admitting: *Deleted

## 2022-03-23 ENCOUNTER — Ambulatory Visit (HOSPITAL_COMMUNITY)
Admission: EM | Admit: 2022-03-23 | Discharge: 2022-03-23 | Disposition: A | Discharge status: Home / Self Care | Payer: Medicare Other | Attending: Family Medicine | Admitting: Family Medicine

## 2022-03-23 DIAGNOSIS — N3090 Cystitis, unspecified without hematuria: Secondary | ICD-10-CM | POA: Insufficient documentation

## 2022-03-23 HISTORY — DX: Rosacea, unspecified: L71.9

## 2022-03-23 LAB — POCT URINALYSIS DIPSTICK, ED / UC
Bilirubin Urine: NEGATIVE
Glucose, UA: NEGATIVE mg/dL
Ketones, ur: NEGATIVE mg/dL
Nitrite: POSITIVE — AB
Protein, ur: NEGATIVE mg/dL
Specific Gravity, Urine: 1.02 (ref 1.005–1.030)
Urobilinogen, UA: 0.2 mg/dL (ref 0.0–1.0)
pH: 5.5 (ref 5.0–8.0)

## 2022-03-23 MED ORDER — NITROFURANTOIN MONOHYD MACRO 100 MG PO CAPS: 100.0000 mg | ORAL_CAPSULE | Freq: Two times a day (BID) | ORAL | 0 refills | Status: AC

## 2022-03-23 NOTE — Discharge Instructions (Addendum)
The urinalysis did show white blood cells, a little bit of red blood cells, and nitrites.  This is consistent with a bladder infection  Take nitrofurantoin 100 mg--1 capsule 2 times daily for 7 days  Make sure you are drinking enough fluids  Staff will call you if it looks like you need a different antibiotic from the results of the urine culture

## 2022-03-23 NOTE — ED Provider Notes (Signed)
[place]    CSN: [id number] Arrival date & time: 03/23/22  1024      History   Chief Complaint No chief complaint on file.   HPI Jasmine Velez is a 81 y.o. female.   HPI Here with pelvic pressure and urinary urgency since about September 7.  No fever or chills.  No nausea or vomiting.  She does have a few drug allergies and states that nitrofurantoin has usually worked well for her and has not caused her side effects.  She is going more frequently than usual, and maybe has the beginnings of some dysuria.  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      Home Medications    Prior to Admission medications   Medication Sig Start Date End Date Taking? Authorizing Provider  ARMOUR THYROID 15 MG tablet TAKE 1 TABLET (15 MG TOTAL) BY MOUTH DAILY. 02/17/22  Yes Elayne Snare, MD  budesonide (PULMICORT) 180 MCG/ACT inhaler Inhale 1 puff 2 (two) times daily into the lungs. 05/18/17  Yes Brand Males, MD  cholecalciferol (VITAMIN D) 25 MCG (1000 UNIT) tablet 2 tablets 10/08/20  Yes [provider]  fluticasone  (FLONASE) 50 MCG/ACT nasal spray SPRAY 2 SPRAYS INTO EACH NOSTRIL EVERY DAY 01/28/22  Yes Brand Males, MD  hydrochlorothiazide (HYDRODIURIL) 25 MG tablet Take 25 mg by mouth daily as needed.   Yes [provider]  hydrocortisone 2.5 % cream 1 application 0/86/76  Yes [provider]  Multiple Vitamin (MULTIVITAMIN WITH MINERALS) TABS tablet Take 1 tablet by mouth daily.   Yes [provider]  nitrofurantoin, macrocrystal-monohydrate, (MACROBID) 100 MG capsule Take 1 capsule (100 mg total) by mouth 2 (two) times daily for 7 days. 03/23/22 03/30/22 Yes Lima Chillemi, Gwenlyn Perking, MD  pravastatin (PRAVACHOL) 20 MG tablet Take 40 mg by mouth every other day.  06/11/17  Yes [provider]  RABEprazole (ACIPHEX) 20 MG tablet Take 20 mg by mouth as needed.    Yes [provider]  albuterol (PROAIR HFA) 108 (90 Base) MCG/ACT inhaler Inhale 1-2 puffs into the lungs every 6 (six) hours as needed for wheezing or shortness of breath. 05/30/18   Wieters, Hallie C, PA-C  clobetasol cream (TEMOVATE) 0.05 % Apply topically 2 (two) times daily. 10/01/21   [provider]    Family History Family History  Problem Relation Age  of Onset   CAD Father    Dementia Mother    Dementia Sister    Colon polyps Neg Hx    Colon cancer Neg Hx    Rectal cancer Neg Hx    Stomach cancer Neg Hx    Thyroid disease Neg Hx    Esophageal cancer Neg Hx    Liver disease Neg Hx    Pancreatic cancer Neg Hx     Social History Social History   Tobacco Use   Smoking status: Never   Smokeless tobacco: Never  Vaping Use   Vaping Use: Never used  Substance Use Topics   Alcohol use: Not Currently   Drug use: No     Allergies   Other, Tramadol, Amlodipine, Ceclor [cefaclor], Erythromycin, Nexium [esomeprazole magnesium], Penicillins, Statins, and Sulfa antibiotics   Review of Systems Review of Systems   Physical Exam Triage Vital Signs ED Triage Vitals  Enc Vitals  Group     BP 03/23/22 1047 (!) 157/79     Pulse Rate 03/23/22 1047 69     Resp 03/23/22 1047 18     Temp 03/23/22 1047 98.3 F (36.8 C)     Temp Source 03/23/22 1047 Oral     SpO2 03/23/22 1047 97 %     Weight --      Height --      Head Circumference --      Peak Flow --      Pain Score 03/23/22 1048 3     Pain Loc --      Pain Edu? --      Excl. in [place]? --    No data found.  Updated Vital Signs BP (!) 157/79   Pulse 69   Temp 98.3 F (36.8 C) (Oral)   Resp 18   SpO2 97%   Visual Acuity Right Eye Distance:   Left Eye Distance:   Bilateral Distance:    Right Eye Near:   Left Eye Near:    Bilateral Near:     Physical Exam   UC Treatments / Results  Labs (all labs ordered are listed, but only abnormal results are displayed) Labs Reviewed  POCT URINALYSIS DIPSTICK, ED / UC - Abnormal; Notable for the following components:      Result Value   Hgb urine dipstick TRACE (*)    Nitrite POSITIVE (*)    Leukocytes,Ua LARGE (*)    All other components within normal limits  URINE CULTURE    EKG   Radiology No results found.  Procedures Procedures (including critical care time)  Medications Ordered in UC Medications - No data to display  Initial Impression / Assessment and Plan / UC Course  I have reviewed the triage vital signs and the nursing notes.  Pertinent labs & imaging results that were available during my care of the patient were reviewed by me and considered in my medical decision making (see chart for details).        UA shows a large amount of leukocytes and is positive for nitrites.  There is also a trace of hemoglobin.  We will treat with the Macrobid, and culture the urine. Final Clinical Impressions(s) / UC Diagnoses   Final diagnoses:  Cystitis     Discharge Instructions      The urinalysis did show white blood cells, a little bit of red blood cells, and nitrites.  This is consistent with a bladder infection  Take  nitrofurantoin 100 mg--1 capsule 2 times daily for  7 days  Make sure you are drinking enough fluids  Staff will call you if it looks like you need a different antibiotic from the results of the urine culture      ED Prescriptions     Medication Sig Dispense Auth. Provider   nitrofurantoin, macrocrystal-monohydrate, (MACROBID) 100 MG capsule Take 1 capsule (100 mg total) by mouth 2 (two) times daily for 7 days. 14 capsule Windy Carina, Gwenlyn Perking, MD      PDMP not reviewed this encounter.   Barrett Henle, MD 03/23/22 [phone]

## 2022-03-23 NOTE — ED Triage Notes (Signed)
C/O bladder pressure, polyuria onset few days ago. Denies any fevers, n/v. Denies hematuria.

## 2022-03-25 LAB — URINE CULTURE: Culture: >=100000 — AB

## 2022-03-26 DIAGNOSIS — U071 COVID-19: Secondary | ICD-10-CM | POA: Diagnosis not present

## 2022-04-04 ENCOUNTER — Other Ambulatory Visit: Payer: Medicare Other

## 2022-04-08 ENCOUNTER — Other Ambulatory Visit: Payer: Medicare Other

## 2022-04-08 ENCOUNTER — Ambulatory Visit: Payer: Medicare Other | Admitting: Endocrinology

## 2022-04-11 ENCOUNTER — Ambulatory Visit: Payer: Medicare Other | Admitting: Endocrinology

## 2022-04-11 DIAGNOSIS — Z8616 Personal history of COVID-19: Secondary | ICD-10-CM | POA: Diagnosis not present

## 2022-04-11 DIAGNOSIS — J45901 Unspecified asthma with (acute) exacerbation: Secondary | ICD-10-CM | POA: Diagnosis not present

## 2022-05-27 DIAGNOSIS — Z96642 Presence of left artificial hip joint: Secondary | ICD-10-CM | POA: Diagnosis not present

## 2022-05-27 DIAGNOSIS — M7062 Trochanteric bursitis, left hip: Secondary | ICD-10-CM | POA: Diagnosis not present

## 2022-05-27 DIAGNOSIS — M25552 Pain in left hip: Secondary | ICD-10-CM | POA: Diagnosis not present

## 2022-06-03 ENCOUNTER — Ambulatory Visit
Admission: RE | Admit: 2022-06-03 | Discharge: 2022-06-03 | Disposition: A | Discharge status: Home / Self Care | Payer: Medicare Other | Source: Ambulatory Visit | Attending: Family Medicine | Admitting: Family Medicine

## 2022-06-03 DIAGNOSIS — E2839 Other primary ovarian failure: Secondary | ICD-10-CM

## 2022-06-03 DIAGNOSIS — M8589 Other specified disorders of bone density and structure, multiple sites: Secondary | ICD-10-CM | POA: Diagnosis not present

## 2022-06-03 DIAGNOSIS — M810 Age-related osteoporosis without current pathological fracture: Secondary | ICD-10-CM | POA: Diagnosis not present

## 2022-06-03 DIAGNOSIS — Z780 Asymptomatic menopausal state: Secondary | ICD-10-CM | POA: Diagnosis not present

## 2022-06-13 ENCOUNTER — Other Ambulatory Visit (INDEPENDENT_AMBULATORY_CARE_PROVIDER_SITE_OTHER): Payer: Medicare Other

## 2022-06-13 DIAGNOSIS — E063 Autoimmune thyroiditis: Secondary | ICD-10-CM

## 2022-06-13 LAB — T4, FREE: Free T4: 0.85 ng/dL (ref 0.60–1.60)

## 2022-06-13 LAB — TSH: TSH: 3.07 u[IU]/mL (ref 0.35–5.50)

## 2022-06-20 ENCOUNTER — Encounter: Payer: Self-pay | Admitting: Endocrinology

## 2022-06-20 ENCOUNTER — Ambulatory Visit (INDEPENDENT_AMBULATORY_CARE_PROVIDER_SITE_OTHER): Payer: Medicare Other | Admitting: Endocrinology

## 2022-06-20 VITALS — BP 140/80 | HR 67 | Ht 61.0 in | Wt 137.2 lb

## 2022-06-20 DIAGNOSIS — J4540 Moderate persistent asthma, uncomplicated: Secondary | ICD-10-CM | POA: Diagnosis not present

## 2022-06-20 DIAGNOSIS — I872 Venous insufficiency (chronic) (peripheral): Secondary | ICD-10-CM | POA: Diagnosis not present

## 2022-06-20 DIAGNOSIS — M810 Age-related osteoporosis without current pathological fracture: Secondary | ICD-10-CM | POA: Diagnosis not present

## 2022-06-20 DIAGNOSIS — R32 Unspecified urinary incontinence: Secondary | ICD-10-CM | POA: Diagnosis not present

## 2022-06-20 DIAGNOSIS — E063 Autoimmune thyroiditis: Secondary | ICD-10-CM | POA: Diagnosis not present

## 2022-06-20 DIAGNOSIS — E7800 Pure hypercholesterolemia, unspecified: Secondary | ICD-10-CM | POA: Diagnosis not present

## 2022-06-20 DIAGNOSIS — I10 Essential (primary) hypertension: Secondary | ICD-10-CM | POA: Diagnosis not present

## 2022-06-20 DIAGNOSIS — R1310 Dysphagia, unspecified: Secondary | ICD-10-CM | POA: Diagnosis not present

## 2022-06-20 LAB — BASIC METABOLIC PANEL
BUN: 18 mg/dL (ref 6–23)
CO2: 31 mEq/L (ref 19–32)
Calcium: 9.7 mg/dL (ref 8.4–10.5)
Chloride: 104 mEq/L (ref 96–112)
Creatinine, Ser: 0.69 mg/dL (ref 0.40–1.20)
GFR: 80.42 mL/min (ref >60.00)
Glucose, Bld: 76 mg/dL (ref 70–99)
Potassium: 3.9 mEq/L (ref 3.5–5.1)
Sodium: 141 mEq/L (ref 135–145)

## 2022-06-20 LAB — VITAMIN D 25 HYDROXY (VIT D DEFICIENCY, FRACTURES): VITD: 65.28 ng/mL (ref 30.00–100.00)

## 2022-06-20 NOTE — Patient Instructions (Signed)
Calcium '500mg'$  daily with food

## 2022-06-20 NOTE — Progress Notes (Signed)
Patient ID: Jasmine Velez, female   DOB: [birth year], 81 y.o.   MRN: [id number]             Reason for Appointment:  Hypothyroidism, follow-up visit    History of Present Illness:   Hypothyroidism was first diagnosed in 09/2018  At the time of diagnosis patient was having routine lab work and TSH was 6.7 She did not have at that time any symptoms of fatigue, cold sensitivity or weight change Not clear if she had abnormal thyroid levels prior to that           The patient has been treated with both levothyroxine and Armour Thyroid When she first started supplementation in 3/20 she was tried on unknown dose of levothyroxine but she thinks it caused her to have more reflux symptoms and she was switched to Armour Thyroid 15 mg daily    With starting thyroid supplementation she did not feel any different  Subsequently her TSH was monitored periodically and ranged between 5.7 and 6.0 in 2020 In 10/2019 she was seen by a different healthcare professional and even though her TSH was only 4.1 she was told to go up to 30 mg of Armour Thyroid Did not again feel any different with the dosage change Subsequent TSH 2 months later in 12/2019 was 2.8  In late summer 2021 she thinks she was starting to have more fatigue overall and continued until this month However she did not complain of any palpitations, shakiness, unusual weight loss or heat intolerance  She had an episode of generalized rash on 06/14/2020 which was treated by dermatologist as allergic dermatitis She was told to stop her Armour Thyroid on 06/20/2020 when her TSH was 0.01 TSH done on 07/02/20 was reportedly 3.6  RECENT history:  At her initial consultation in 06/2020 she was told not to take any thyroid supplements since she was felt to have subclinical hyperthyroidism with 50 mcg of Armour Thyroid She came back in 5/22 since she was having more symptoms of feeling more fatigued and needing to take a nap in the afternoon as well as  being more sensitive to cold, also noticing some forgetfulness  Since she was symptomatic and her TSH was higher at 5.8 she was started back on Armour Thyroid 15 mg daily With this she started feeling back to normal Last visit was in 3/23  Although she tends to get tired or sleepy at some time during the day requiring a nap overall she feels fairly good especially after getting over her COVID infection in September  Tends to have mild cold intolerance; she does also lose weight with her COVID infection  She has been regular with her Armour Thyroid regularly on empty stomach in the mornings  TSH is consistently around 3 now         Patient's weight history is as follows:  Wt Readings from Last 3 Encounters:  06/20/22 137 lb 3.2 oz (62.2 kg)  01/08/22 142 lb 12.8 oz (64.8 kg)  10/09/21 140 lb 12.8 oz (63.9 kg)    Thyroid function results have been as follows:  Lab Results  Component Value Date   TSH 3.07 06/13/2022   TSH 3.65 10/04/2021   TSH 5.04 04/08/2021   TSH 4.17 01/07/2021   FREET4 0.85 06/13/2022   FREET4 0.90 10/04/2021   FREET4 0.88 04/08/2021   FREET4 0.89 01/07/2021   T3FREE 3.2 04/08/2021   T3FREE 3.6 01/07/2021     Past Medical History:  Diagnosis Date  Allergy    sinus issues with weather changes    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    Asthma    Cancer ([place])    colon cancer    Cataract    removed both eyes    GERD (gastroesophageal reflux disease)    Hyperlipidemia    PUD (peptic ulcer disease)    25 yrs ago    Rosacea    Sciatica    UTI (lower urinary tract infection)     Past Surgical History:  Procedure Laterality Date   CATARACT EXTRACTION  2013, 2014   COLON RESECTION  2008   COLONOSCOPY     LASIK  2000   NASAL SEPTUM SURGERY  1980's   PILONIDAL CYST EXCISION  1960   TONSILLECTOMY  1960   TOTAL HIP ARTHROPLASTY  1994    Family History  Problem Relation Age of Onset   CAD Father    Dementia Mother    Dementia Sister     Colon polyps Neg Hx    Colon cancer Neg Hx    Rectal cancer Neg Hx    Stomach cancer Neg Hx    Thyroid disease Neg Hx    Esophageal cancer Neg Hx    Liver disease Neg Hx    Pancreatic cancer Neg Hx     Social History:  reports that she has never smoked. She has never used smokeless tobacco. She reports that she does not currently use alcohol. She reports that she does not use drugs.  Allergies:  Allergies  Allergen Reactions   Other Other (See Comments)    ALL peppers causes severe gastric distress per patient.  Paprika.    Tramadol Nausea And Vomiting   Amlodipine Nausea Only   Ceclor [Cefaclor] Nausea Only    Gerd   Erythromycin Rash    In mouth   Nexium [Esomeprazole Magnesium] Nausea Only    sick   Penicillins Rash    In her mouth   Statins Other (See Comments)    Muscle pain, nausea, reflux   Sulfa Antibiotics Rash    Allergies as of 06/20/2022       Reactions   Other Other (See Comments)   ALL peppers causes severe gastric distress per patient.  Paprika.    Tramadol Nausea And Vomiting   Amlodipine Nausea Only   Ceclor [cefaclor] Nausea Only   Gerd   Erythromycin Rash   In mouth   Nexium [esomeprazole Magnesium] Nausea Only   sick   Penicillins Rash   In her mouth   Statins Other (See Comments)   Muscle pain, nausea, reflux   Sulfa Antibiotics Rash        Medication List        Accurate as of June 20, 2022 11:41 AM. If you have any questions, ask your nurse or doctor.          albuterol 108 (90 Base) MCG/ACT inhaler Commonly known as: ProAir HFA Inhale 1-2 puffs into the lungs every 6 (six) hours as needed for wheezing or shortness of breath.   Armour Thyroid 15 MG tablet Generic drug: thyroid TAKE 1 TABLET (15 MG TOTAL) BY MOUTH DAILY.   budesonide 180 MCG/ACT inhaler Commonly known as: PULMICORT Inhale 1 puff 2 (two) times daily into the lungs.   cholecalciferol 25 MCG (1000 UNIT) tablet Commonly known as: VITAMIN D3 2  tablets   clobetasol cream 0.05 % Commonly known as: TEMOVATE Apply topically 2 (two) times daily.  fluticasone 50 MCG/ACT nasal spray Commonly known as: FLONASE SPRAY 2 SPRAYS INTO EACH NOSTRIL EVERY DAY   hydrochlorothiazide 25 MG tablet Commonly known as: HYDRODIURIL Take 25 mg by mouth daily as needed.   hydrocortisone 2.5 % cream 1 application   multivitamin with minerals Tabs tablet Take 1 tablet by mouth daily.   pravastatin 20 MG tablet Commonly known as: PRAVACHOL Take 40 mg by mouth every other day.   RABEprazole 20 MG tablet Commonly known as: ACIPHEX Take 20 mg by mouth as needed.           Review of Systems  She is taking HydroDIURIL as needed for edema of legs      OSTEOPOROSIS: Her recent T-score was -2.5 and she thinks she is getting a medication now to take weekly on empty stomach No history of fractures  Currently taking a total of 3000 U VITAMIN D3 However taking only a total of 280 mg of calcium daily         Examination:    BP (!) 140/80   Pulse 67   Ht '5\' 1"'$  (1.549 m)   Wt 137 lb 3.2 oz (62.2 kg)   SpO2 99%   BMI 25.92 kg/m   Thyroid not palpable  Assessment:  HYPOTHYROIDISM, mild with highest TSH 6.7, symptomatic No goiter by history  She has subjectively done well with 15 mg Armour Thyroid regularly Note she reports GI intolerance to levothyroxine  TSH is very stable around 3 and she has been regular with her Armour Thyroid   Osteoporosis: This is mild as above  PLAN:   Continue same dose of 15 mg Armour Thyroid   She does need to have vitamin D and calcium levels checked, no labs available in the chart and will do so today She does need to take at least 500 mg calcium daily, currently not taking enough    Elayne Snare 06/20/2022, 11:41 AM    Note: This office note was prepared with Dragon voice recognition system technology. Any transcriptional errors that result from this process are unintentional.

## 2022-07-15 ENCOUNTER — Other Ambulatory Visit: Payer: Medicare Other

## 2022-07-17 ENCOUNTER — Ambulatory Visit: Payer: Medicare Other | Admitting: Endocrinology

## 2022-07-28 DIAGNOSIS — Z23 Encounter for immunization: Secondary | ICD-10-CM | POA: Diagnosis not present

## 2022-08-08 ENCOUNTER — Other Ambulatory Visit: Payer: Self-pay | Admitting: Endocrinology

## 2022-08-11 DIAGNOSIS — M5442 Lumbago with sciatica, left side: Secondary | ICD-10-CM | POA: Diagnosis not present

## 2022-08-11 DIAGNOSIS — M25552 Pain in left hip: Secondary | ICD-10-CM | POA: Diagnosis not present

## 2022-08-13 ENCOUNTER — Other Ambulatory Visit: Payer: Self-pay | Admitting: Orthopedic Surgery

## 2022-08-13 DIAGNOSIS — M545 Low back pain, unspecified: Secondary | ICD-10-CM

## 2022-08-26 ENCOUNTER — Ambulatory Visit
Admission: RE | Admit: 2022-08-26 | Discharge: 2022-08-26 | Disposition: A | Discharge status: Home / Self Care | Payer: Medicare Other | Source: Ambulatory Visit | Attending: Orthopedic Surgery | Admitting: Orthopedic Surgery

## 2022-08-26 DIAGNOSIS — M545 Low back pain, unspecified: Secondary | ICD-10-CM

## 2022-08-26 DIAGNOSIS — R200 Anesthesia of skin: Secondary | ICD-10-CM | POA: Diagnosis not present

## 2022-08-28 DIAGNOSIS — M5441 Lumbago with sciatica, right side: Secondary | ICD-10-CM | POA: Diagnosis not present

## 2022-09-24 DIAGNOSIS — Z9889 Other specified postprocedural states: Secondary | ICD-10-CM | POA: Diagnosis not present

## 2022-09-24 DIAGNOSIS — H02831 Dermatochalasis of right upper eyelid: Secondary | ICD-10-CM | POA: Diagnosis not present

## 2022-09-24 DIAGNOSIS — H26491 Other secondary cataract, right eye: Secondary | ICD-10-CM | POA: Diagnosis not present

## 2022-09-24 DIAGNOSIS — H353132 Nonexudative age-related macular degeneration, bilateral, intermediate dry stage: Secondary | ICD-10-CM | POA: Diagnosis not present

## 2022-09-24 DIAGNOSIS — H02885 Meibomian gland dysfunction left lower eyelid: Secondary | ICD-10-CM | POA: Diagnosis not present

## 2022-09-24 DIAGNOSIS — H04123 Dry eye syndrome of bilateral lacrimal glands: Secondary | ICD-10-CM | POA: Diagnosis not present

## 2022-09-24 DIAGNOSIS — H02822 Cysts of right lower eyelid: Secondary | ICD-10-CM | POA: Diagnosis not present

## 2022-09-24 DIAGNOSIS — H02834 Dermatochalasis of left upper eyelid: Secondary | ICD-10-CM | POA: Diagnosis not present

## 2022-09-24 DIAGNOSIS — H43811 Vitreous degeneration, right eye: Secondary | ICD-10-CM | POA: Diagnosis not present

## 2022-09-24 DIAGNOSIS — Z961 Presence of intraocular lens: Secondary | ICD-10-CM | POA: Diagnosis not present

## 2022-10-06 DIAGNOSIS — M5416 Radiculopathy, lumbar region: Secondary | ICD-10-CM | POA: Diagnosis not present

## 2022-10-21 DIAGNOSIS — M5416 Radiculopathy, lumbar region: Secondary | ICD-10-CM | POA: Diagnosis not present

## 2022-10-28 DIAGNOSIS — M67441 Ganglion, right hand: Secondary | ICD-10-CM | POA: Diagnosis not present

## 2022-11-10 DIAGNOSIS — M5416 Radiculopathy, lumbar region: Secondary | ICD-10-CM | POA: Diagnosis not present

## 2022-11-24 ENCOUNTER — Encounter: Payer: Self-pay | Admitting: Nurse Practitioner

## 2022-11-24 ENCOUNTER — Ambulatory Visit (INDEPENDENT_AMBULATORY_CARE_PROVIDER_SITE_OTHER): Payer: Medicare Other

## 2022-11-24 ENCOUNTER — Ambulatory Visit (INDEPENDENT_AMBULATORY_CARE_PROVIDER_SITE_OTHER): Payer: Medicare Other | Admitting: Nurse Practitioner

## 2022-11-24 ENCOUNTER — Telehealth: Payer: Self-pay

## 2022-11-24 VITALS — BP 140/82 | HR 79 | Temp 98.1°F | Ht 60.0 in | Wt 134.0 lb

## 2022-11-24 DIAGNOSIS — J4531 Mild persistent asthma with (acute) exacerbation: Secondary | ICD-10-CM

## 2022-11-24 DIAGNOSIS — J209 Acute bronchitis, unspecified: Secondary | ICD-10-CM

## 2022-11-24 DIAGNOSIS — J45909 Unspecified asthma, uncomplicated: Secondary | ICD-10-CM | POA: Diagnosis not present

## 2022-11-24 DIAGNOSIS — R059 Cough, unspecified: Secondary | ICD-10-CM | POA: Diagnosis not present

## 2022-11-24 LAB — POCT EXHALED NITRIC OXIDE: FeNO level (ppb): 9

## 2022-11-24 MED ORDER — METHYLPREDNISOLONE ACETATE 80 MG/ML IJ SUSP: 80.0000 mg | Freq: Once | INTRAMUSCULAR | Status: AC

## 2022-11-24 MED ORDER — BENZONATATE 200 MG PO CAPS: 200.0000 mg | ORAL_CAPSULE | Freq: Three times a day (TID) | ORAL | 1 refills | Status: DC | PRN

## 2022-11-24 MED ORDER — PREDNISONE 20 MG PO TABS: 40.0000 mg | ORAL_TABLET | Freq: Every day | ORAL | 0 refills | Status: AC

## 2022-11-24 MED ORDER — DOXYCYCLINE HYCLATE 100 MG PO TABS: 100.0000 mg | ORAL_TABLET | Freq: Two times a day (BID) | ORAL | 0 refills | Status: AC

## 2022-11-24 NOTE — Assessment & Plan Note (Signed)
See above. Continue ICS for maintenance and PRN SABA. Action plan in place.

## 2022-11-24 NOTE — Assessment & Plan Note (Addendum)
Bronchitic type illness. FeNO nl today but she has bronchospasm and bronchitic cough on exam. She is out of the window for viral testing. Will treat her with empiric doxycycline and prednisone burst. Depo inj 80 mg x 1 administered in office. Cough control measures. CXR to rule out superimposed infection  Patient Instructions  Continue pulmicort 1 puff Twice daily. Brush tongue and rinse mouth afterwards Continue Albuterol inhaler 2 puffs every 6 hours as needed for shortness of breath or wheezing. Notify if symptoms persist despite rescue inhaler/neb use. Continue flonase 2 sprays each nostril daily   -Prednisone 40 mg daily for 5 days. Take in AM with food. Start tomorrow -Doxycycline 1 tab Twice daily for 7 days. Take with food. Wear sunscreen while you are taking this as it can increase your risk for sunburns  -Continue Robitussin DM over the counter for cough/congestion -Benzonatate 1 capsule Three times a day for cough. Use consistently over the next few days   Chest x ray today    Follow up in 2 weeks with Jasmine Alija Riano,Jasmine Velez then follow up with Jasmine Velez in 4 months. If symptoms do not improve or worsen, please contact office for sooner follow up or seek emergency care.

## 2022-11-24 NOTE — Patient Instructions (Addendum)
Continue pulmicort 1 puff Twice daily. Brush tongue and rinse mouth afterwards Continue Albuterol inhaler 2 puffs every 6 hours as needed for shortness of breath or wheezing. Notify if symptoms persist despite rescue inhaler/neb use. Continue flonase 2 sprays each nostril daily   -Prednisone 40 mg daily for 5 days. Take in AM with food. Start tomorrow -Doxycycline 1 tab Twice daily for 7 days. Take with food. Wear sunscreen while you are taking this as it can increase your risk for sunburns  -Continue Robitussin DM over the counter for cough/congestion -Benzonatate 1 capsule Three times a day for cough. Use consistently over the next few days   Chest x ray today    Follow up in 2 weeks with Katie Jermiah Soderman,NP then follow up with Dr. Marchelle Gearing in 4 months. If symptoms do not improve or worsen, please contact office for sooner follow up or seek emergency care.

## 2022-11-24 NOTE — Progress Notes (Unsigned)
@Patient  ID: Jasmine Velez, female    DOB: Aug 28, 1940, 82 y.o.   MRN: [id number]  Chief Complaint  Patient presents with   Acute Visit    (1 week ago)Chest congestion, cough. Pt does has history of asthma,     Referring provider: Shon Hale, *  HPI: 82 year old female, never smoker followed for mild persistent asthma.  She is a patient Dr. Jane Canary and last seen in office on 01/08/2022 by [facility] NP.  Past medical history significant for hypothyroidism, HLD, hypertension.  TEST/EVENTS:  08/11/2014: FVC 93, FEV1 80, ratio 65  11/16/2019: OV with Dr. Marchelle Gearing.  Patient doing well overall.  Albuterol use is essentially nonexistent.  She did have some recent stressful events with the 14th anniversary of her first husband and 10th anniversary of meeting her late significant other but was feeling better.  Continued Pulmicort 1 puff twice daily and albuterol as needed.  01/08/2022: OV with Nazareth Kirk NP for overdue follow-up.  She has been feeling well since we saw her 2 years ago.  Breathing has been overall stable.  She has used her albuterol 1 or 2 times this month.  She has been dealing with some stressful life events as they just found out that her significant other has been diagnosed with bladder cancer and has been undergoing multiple different procedures.  She attributes this to her intermittent episodes, which has been a trigger for her in the past as well.  Otherwise, she has felt as though her breathing has been doing great.  She has not had any exacerbations requiring a visit to the ED or urgent care.  She has not been on any prednisone or antibiotics in the last year.  She continues on Pulmicort twice daily.   11/24/2022: Today - acute Patient presents today for acute visit. She has felt bad over the last week with what started as chest congestion and a cough. Symptoms have worsened over the past few days. She is producing cream to yellow phlegm. Feels more short winded. Has noticed  some occasional wheezing, more so at night. Denies any fevers, chills, hemoptysis, leg swelling, orthopnea. No known sick exposures. No sinus symptoms. Eating and drinking normally. She is using her rescue inhaler once a day. Taking robitussin DM, which does help some.    Allergies  Allergen Reactions   Other Other (See Comments)    ALL peppers causes severe gastric distress per patient.  Paprika.    Tramadol Nausea And Vomiting   Amlodipine Nausea Only   Ceclor [Cefaclor] Nausea Only    Gerd   Erythromycin Rash    In mouth   Nexium [Esomeprazole Magnesium] Nausea Only    sick   Penicillins Rash    In her mouth   Statins Other (See Comments)    Muscle pain, nausea, reflux   Sulfa Antibiotics Rash    Immunization History  Administered Date(s) Administered   Influenza Split 04/13/2014   Influenza, High Dose Seasonal PF 05/11/2017, 05/14/2018, 04/09/2019, 03/28/2021   Influenza,inj,Quad PF,6+ Mos 04/14/2015   PFIZER(Purple Top)SARS-COV-2 Vaccination 08/04/2019, 08/25/2019, 04/03/2020, 10/13/2020   PNEUMOCOCCAL CONJUGATE-20 12/07/2020   Pfizer Covid-19 Vaccine Bivalent Booster 46yrs & up 03/28/2021   Pneumococcal Conjugate-13 08/11/2014   Pneumococcal Polysaccharide-23 05/18/2018   Td 12/05/2015    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 1 Inhaler 5   ARMOUR THYROID 15 MG tablet TAKE 1 TABLET (15 MG TOTAL) BY MOUTH DAILY. 90 tablet 1   budesonide (PULMICORT) 180 MCG/ACT inhaler Inhale 1 puff 2 (two) times daily into the lungs. 1 Inhaler 3   cholecalciferol (VITAMIN D) 25 MCG (1000 UNIT) tablet 2 tablets     clobetasol cream (TEMOVATE) 0.05 % Apply topically 2 (two) times daily.     fluticasone (FLONASE) 50 MCG/ACT nasal spray SPRAY 2 SPRAYS INTO EACH NOSTRIL EVERY DAY 16 mL 3   hydrochlorothiazide (HYDRODIURIL) 25 MG tablet Take 25 mg by mouth daily as needed.     hydrocortisone 2.5 % cream 1 application     Multiple Vitamin (MULTIVITAMIN WITH MINERALS) TABS tablet Take 1 tablet by mouth daily.     pravastatin (PRAVACHOL) 20 MG tablet Take 40 mg by mouth every other day.   12   RABEprazole (ACIPHEX) 20 MG tablet Take 20 mg by mouth as needed.      No facility-administered medications prior to visit.     Review of Systems:   Constitutional: No weight loss or gain, night sweats, fevers, chills,  or lassitude. +fatigue  HEENT: No headaches, difficulty swallowing, tooth/dental problems, or sore throat. No sneezing, itching, ear ache, nasal congestion, or post nasal drip CV:  No chest pain, orthopnea, PND, swelling in lower extremities, anasarca, dizziness, palpitations, syncope Resp: +shortness of breath with exertion; productive cough; wheezing. No hemoptysis. No chest wall deformity Skin: No rash, lesions, ulcerations MSK:  No joint pain or swelling.  No decreased range of motion.  No back pain. Neuro: No dizziness or lightheadedness.  Psych: No depression or anxiety. Mood stable.     Physical Exam:  BP (!) 140/82   Pulse 79   Temp 98.1 F (36.7 C) (Oral)   Ht 5' (1.524 m)   Wt 134 lb (60.8 kg)   SpO2 99%   BMI 26.17 kg/m   GEN: Pleasant, interactive, well-kempt; non-toxic and in no acute distress. HEENT:  Normocephalic and atraumatic. PERRLA. Sclera white.  Nasal turbinates pink, moist and patent bilaterally. No rhinorrhea present. Oropharynx pink and moist, without exudate or edema. No lesions, ulcerations, or postnasal drip.  NECK:  Supple w/ fair ROM.  CV: RRR, no m/r/g, no peripheral edema. Pulses intact, +2 bilaterally. No cyanosis, pallor or clubbing. PULMONARY:  Unlabored, regular breathing. Scattered rhonchi and expiratory wheezes bilaterally A&P. No accessory muscle use. No dullness to percussion. GI: BS present and normoactive. Soft, non-tender to palpation.  MSK: No erythema, warmth or tenderness. Cap refil <2 sec all extrem. No deformities or joint swelling noted.  Neuro: A/Ox3. No focal deficits noted.   Skin: Warm, no lesions or rashe Psych: Normal affect and behavior. Judgement and thought content appropriate.     Lab Results:  CBC  No results found for: "WBC", "RBC", "HGB", "HCT", "PLT", "MCV", "MCH", "MCHC", "RDW", "LYMPHSABS", "MONOABS", "EOSABS", "BASOSABS"  BMET    Component Value Date/Time   NA 141 06/20/2022 1155   K 3.9 06/20/2022 1155   CL 104 06/20/2022 1155   CO2 31 06/20/2022 1155   GLUCOSE 76 06/20/2022 1155   BUN 18 06/20/2022 1155   CREATININE 0.69 06/20/2022 1155   CALCIUM 9.7 06/20/2022 1155    BNP No results found for: "BNP"   Imaging:  DG Chest 2 View  Result Date: 11/24/2022 CLINICAL DATA:  Productive cough.  Asthmatic bronchitis. EXAM: CHEST - 2 VIEW COMPARISON:  05/30/2018 FINDINGS: The lungs are slightly hyperinflated. There is perihilar peribronchial thickening. There are no focal consolidations or pleural effusions. A 1 centimeter opacity overlies the RIGHT UPPER lobe, raising the question of pulmonary nodule and warranting further evaluation. There is a chronic S shaped scoliosis of the thoracolumbar spine. IMPRESSION: 1. Hyperinflation and bronchitic changes. 2. Possible RIGHT UPPER lobe pulmonary nodule. Recommend further evaluation with CT of the chest. 3. No focal infiltrate.  Electronically Signed   By: Norva Pavlov M.D.   On: 11/24/2022 11:59          No data to display          Lab Results  Component Value Date   NITRICOXIDE 17 05/18/2018        Assessment & Plan:   Acute bronchitis Bronchitic type illness. FeNO nl today but she has bronchospasm and bronchitic cough on exam. She is out of the window for viral testing. Will treat her with empiric doxycycline and prednisone burst. Depo inj 80 mg x 1 administered in office. Cough control measures. CXR to rule out superimposed infection  Patient Instructions  Continue pulmicort 1 puff Twice daily. Brush tongue and rinse mouth afterwards Continue Albuterol inhaler 2 puffs every 6 hours as needed for shortness of breath or wheezing. Notify if symptoms persist despite rescue inhaler/neb use. Continue flonase 2 sprays each nostril daily   -Prednisone 40 mg daily for 5 days. Take in AM with food. Start tomorrow -Doxycycline 1 tab Twice daily for 7 days. Take with food. Wear sunscreen while you are taking this as it can increase your risk for sunburns  -Continue Robitussin DM over the counter for cough/congestion -Benzonatate 1 capsule Three times a day for cough. Use consistently over the next few days   Chest x ray today    Follow up in 2 weeks with Katie Deseray Daponte,NP then follow up with Dr. Marchelle Gearing in 4 months. If symptoms do not improve or worsen, please contact office for sooner follow up or seek emergency care.    Mild persistent asthma See above. Continue ICS for maintenance and PRN SABA. Action plan in place.   I spent 35 minutes of dedicated to the care of this patient on the date of this encounter to include pre-visit review of records, face-to-face time with the patient discussing conditions above, post visit ordering of testing, clinical documentation with the electronic health record, making appropriate referrals as documented, and communicating necessary findings to members of the  patients care team.  Noemi Chapel, NP 11/24/2022  Pt aware and understands NP's role.

## 2022-11-24 NOTE — Telephone Encounter (Signed)
Spoke with patient regarding results. She is a never smoker with current productive cough/sick symptoms. Likely infectious. She has been started on doxycycline already. Instructed her to complete this and monitor symptoms. Plan for repeat imaging at follow up. If no improvement, can consider CT imaging at that time. Nothing further needed. Thanks.

## 2022-11-24 NOTE — Telephone Encounter (Signed)
Call report: [facility] regarding pt CXR findings - KC please see report and advise.

## 2022-11-26 ENCOUNTER — Encounter: Payer: Self-pay | Admitting: Nurse Practitioner

## 2022-11-26 NOTE — Progress Notes (Signed)
11/24/2022 Addendum: CXR with RUL 1 cm opacity, likely infectious given symptoms. Never smoker. No hemoptysis, weight loss, anorexia. Prescribed doxycycline at OV. Will reassess at follow up and determine if CT imaging is necessary based on improvement.

## 2022-11-28 ENCOUNTER — Telehealth: Payer: Self-pay | Admitting: Nurse Practitioner

## 2022-11-28 NOTE — Telephone Encounter (Signed)
Spoke with patient. I advised per Florentina Addison it is safe for her to take mucinex with antibiotic. Advised if she is still taking robitussin its the same as mucinex and she should take one or the other. Patient verbalized understanding. NFN

## 2022-11-28 NOTE — Telephone Encounter (Signed)
Katie please advise.  

## 2022-11-28 NOTE — Telephone Encounter (Signed)
She was taking Robitussin DM when I saw her. This has the same medications that mucinex does so she should take one or the other. Fine to use with abx. Thanks.

## 2022-12-11 ENCOUNTER — Ambulatory Visit (INDEPENDENT_AMBULATORY_CARE_PROVIDER_SITE_OTHER): Payer: Medicare Other | Admitting: Nurse Practitioner

## 2022-12-11 ENCOUNTER — Encounter: Payer: Self-pay | Admitting: Nurse Practitioner

## 2022-12-11 VITALS — BP 118/68 | HR 60 | Temp 98.0°F | Ht 60.0 in | Wt 131.0 lb

## 2022-12-11 DIAGNOSIS — J4530 Mild persistent asthma, uncomplicated: Secondary | ICD-10-CM

## 2022-12-11 DIAGNOSIS — J189 Pneumonia, unspecified organism: Secondary | ICD-10-CM

## 2022-12-11 NOTE — Assessment & Plan Note (Signed)
RUL nodular opacity on imaging. See above.

## 2022-12-11 NOTE — Progress Notes (Signed)
@Patient  ID: Jasmine Velez, female    DOB: 02/20/1941, 82 y.o.   MRN: [id number]  Chief Complaint  Patient presents with   Follow-up    Pt states she is doing better, has been using inhaler as prescribed. No coughing, and no c/o     Referring provider: Shon Hale, *  HPI: 82 year old female, never smoker followed for mild persistent asthma.  She is a patient Dr. Jane Canary and last seen in office on 11/24/2022 by [facility] NP.  Past medical history significant for hypothyroidism, HLD, hypertension.  TEST/EVENTS:  08/11/2014: FVC 93, FEV1 80, ratio 65 11/24/2022 CXR: perihilar peribronchial thickening. 1 cm opacity overlies the RUL, raising the question of pulmonary nodule. Chronic S shaped scoliosis   11/16/2019: OV with Dr. Marchelle Gearing.  Patient doing well overall.  Albuterol use is essentially nonexistent.  She did have some recent stressful events with the 14th anniversary of her first husband and 10th anniversary of meeting her late significant other but was feeling better.  Continued Pulmicort 1 puff twice daily and albuterol as needed.  01/08/2022: OV with Arnet Hofferber NP for overdue follow-up.  She has been feeling well since we saw her 2 years ago.  Breathing has been overall stable.  She has used her albuterol 1 or 2 times this month.  She has been dealing with some stressful life events as they just found out that her significant other has been diagnosed with bladder cancer and has been undergoing multiple different procedures.  She attributes this to her intermittent episodes, which has been a trigger for her in the past as well.  Otherwise, she has felt as though her breathing has been doing great.  She has not had any exacerbations requiring a visit to the ED or urgent care.  She has not been on any prednisone or antibiotics in the last year.  She continues on Pulmicort twice daily.   11/24/2022: OV with Alanys Godino NP for acute visit. She has felt bad over the last week with what started as  chest congestion and a cough. Symptoms have worsened over the past few days. She is producing cream to yellow phlegm. Feels more short winded. Has noticed some occasional wheezing, more so at night. Denies any fevers, chills, hemoptysis, leg swelling, orthopnea. No known sick exposures. No sinus symptoms. Eating and drinking normally. She is using her rescue inhaler once a day. Taking robitussin DM, which does help some.   12/11/2022: Today - follow up Patient presents today for follow up. She is feeling much better. Cough has resolved. She feels like her breathing is back to her normal. No longer wheezing. Feels like she's sleeping better at night now. She hasn't had to use her rescue inhaler over the past few days. No fevers, chills, hemoptysis. Eating and drinking well. Using pulmicort.   Allergies  Allergen Reactions   Other Other (See Comments)    ALL peppers causes severe gastric distress per patient.  Paprika.    Tramadol Nausea And Vomiting   Amlodipine Nausea Only   Ceclor [Cefaclor] Nausea Only    Gerd   Erythromycin Rash    In mouth   Nexium [Esomeprazole Magnesium] Nausea Only    sick   Penicillins Rash    In her mouth   Statins Other (See Comments)    Muscle pain, nausea, reflux   Sulfa Antibiotics Rash    Immunization History  Administered Date(s) Administered   Influenza Split 04/13/2014   Influenza, High Dose Seasonal PF 05/11/2017, 05/14/2018,  04/09/2019, 03/28/2021   Influenza,inj,Quad PF,6+ Mos 04/14/2015   PFIZER(Purple Top)SARS-COV-2 Vaccination 08/04/2019, 08/25/2019, 04/03/2020, 10/13/2020   PNEUMOCOCCAL CONJUGATE-20 12/07/2020   Pfizer Covid-19 Vaccine Bivalent Booster 23yrs & up 03/28/2021   Pneumococcal Conjugate-13 08/11/2014   Pneumococcal Polysaccharide-23 05/18/2018   Td 12/05/2015    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 1 Inhaler 5   ARMOUR THYROID 15 MG tablet TAKE 1 TABLET (15 MG TOTAL) BY MOUTH DAILY. 90 tablet 1   budesonide (PULMICORT) 180 MCG/ACT inhaler Inhale 1 puff 2 (two) times daily into the lungs. 1 Inhaler 3   cholecalciferol (VITAMIN D) 25 MCG (1000 UNIT) tablet 2 tablets     fluticasone (FLONASE) 50 MCG/ACT nasal spray SPRAY 2 SPRAYS INTO EACH NOSTRIL EVERY DAY 16 mL 3   hydrochlorothiazide (HYDRODIURIL) 25 MG tablet Take 25 mg by mouth daily as needed.     hydrocortisone 2.5 % cream 1 application     Multiple Vitamin (MULTIVITAMIN WITH MINERALS) TABS tablet Take 1 tablet by mouth daily.     pravastatin (PRAVACHOL) 20 MG tablet Take 40 mg by mouth every other day.   12   RABEprazole (ACIPHEX) 20 MG tablet Take 20 mg by mouth as needed.      benzonatate (TESSALON) 200 MG capsule Take 1 capsule (200 mg total) by mouth 3 (three) times daily as needed for cough. 30 capsule 1   clobetasol cream (TEMOVATE) 0.05 % Apply topically 2 (two) times daily.     Facility-Administered Medications Prior to Visit  Medication Dose Route Frequency Provider Last Rate Last Admin   methylPREDNISolone acetate (DEPO-MEDROL) injection 80 mg  80 mg Intramuscular Once Selina Tapper, Ruby Cola, NP         Review of Systems:   Constitutional: No weight loss or gain, night sweats, fevers, chills, fatigue or lassitude HEENT: No headaches, difficulty  swallowing, tooth/dental problems, or sore throat. No sneezing, itching, ear ache, nasal congestion, or post nasal drip CV:  No chest pain, orthopnea, PND, swelling in lower extremities, anasarca, dizziness, palpitations, syncope Resp: No shortness of breath with exertion; productive cough; wheezing. No hemoptysis. No chest wall deformity Skin: No rash, lesions, ulcerations MSK:  No joint pain or swelling.  No decreased range of motion.  No back pain. Neuro: No dizziness or lightheadedness.  Psych: No depression or anxiety. Mood stable.     Physical Exam:  BP 118/68   Pulse 60   Temp 98 F (36.7 C)   Ht 5' (1.524 m)   Wt 131 lb (59.4 kg)   SpO2 97%   BMI 25.58 kg/m   GEN: Pleasant, interactive, well-kempt;  non-toxic and in no acute distress. HEENT:  Normocephalic and atraumatic. PERRLA. Sclera white. Nasal turbinates pink, moist and patent bilaterally. No rhinorrhea present. Oropharynx pink and moist, without exudate or edema. No lesions, ulcerations, or postnasal drip.  NECK:  Supple w/ fair ROM.  CV: RRR, no m/r/g, no peripheral edema. Pulses intact, +2 bilaterally. No cyanosis, pallor or clubbing. PULMONARY:  Unlabored, regular breathing. Clear bilaterally A&P w/o wheezes/rales/rhonchi. No accessory muscle use. No dullness to percussion. GI: BS present and normoactive. Soft, non-tender to palpation.  MSK: No erythema, warmth or tenderness. Cap refil <2 sec all extrem. No deformities or joint swelling noted.  Neuro: A/Ox3. No focal deficits noted.   Skin: Warm, no lesions or rashe Psych: Normal affect and behavior. Judgement and thought content appropriate.     Lab Results:  CBC No results found for: "WBC", "RBC", "HGB", "HCT", "PLT", "MCV", "MCH", "MCHC", "RDW", "LYMPHSABS", "MONOABS", "EOSABS", "BASOSABS"  BMET    Component Value Date/Time   NA 141 06/20/2022 1155   K 3.9 06/20/2022 1155   CL 104 06/20/2022 1155   CO2 31 06/20/2022 1155   GLUCOSE 76 06/20/2022 1155    BUN 18 06/20/2022 1155   CREATININE 0.69 06/20/2022 1155   CALCIUM 9.7 06/20/2022 1155    BNP No results found for: "BNP"   Imaging:  DG Chest 2 View  Result Date: 11/24/2022 CLINICAL DATA:  Productive cough.  Asthmatic bronchitis. EXAM: CHEST - 2 VIEW COMPARISON:  05/30/2018 FINDINGS: The lungs are slightly hyperinflated. There is perihilar peribronchial thickening. There are no focal consolidations or pleural effusions. A 1 centimeter opacity overlies the RIGHT UPPER lobe, raising the question of pulmonary nodule and warranting further evaluation. There is a chronic S shaped scoliosis of the thoracolumbar spine. IMPRESSION: 1. Hyperinflation and bronchitic changes. 2. Possible RIGHT UPPER lobe pulmonary nodule. Recommend further evaluation with CT of the chest. 3. No focal infiltrate. Electronically Signed   By: Norva Pavlov M.D.   On: 11/24/2022 11:59          No data to display          Lab Results  Component Value Date   NITRICOXIDE 17 05/18/2018        Assessment & Plan:   Mild persistent asthma Resolved exacerbation. Clinically improved. CXR with 1 cm opacity in the RUL, likely infectious given never smoker and resolution of infectious symptoms with abx. Plan to repeat CXR in 4 weeks to ensure resolution. If this area persists, will obtain CT chest for further evaluation. She will continue ICS maintenance inhaler. Action plan in place.  Patient Instructions  Continue pulmicort 1 puff Twice daily. Brush tongue and rinse mouth afterwards Continue Albuterol inhaler 2 puffs every 6 hours as needed for shortness of breath or wheezing. Notify if symptoms persist despite rescue inhaler/neb use. Continue flonase 2 sprays each nostril daily   Repeat chest x ray in 4 weeks    Follow up in 4 weeks with Dr. Marchelle Gearing or [facility] and repeat CXR beforehand. If symptoms do not improve or worsen, please contact office for sooner follow up or seek emergency care   CAP  (community acquired pneumonia) RUL nodular opacity on imaging. See above.     I spent 28 minutes of dedicated to the care of this patient on the date of this encounter to include pre-visit review of records, face-to-face time with the patient discussing conditions above, post visit ordering of testing, clinical documentation with the electronic health record, making appropriate referrals as  documented, and communicating necessary findings to members of the patients care team.  Noemi Chapel, NP 12/11/2022  Pt aware and understands NP's role.

## 2022-12-11 NOTE — Patient Instructions (Addendum)
Continue pulmicort 1 puff Twice daily. Brush tongue and rinse mouth afterwards Continue Albuterol inhaler 2 puffs every 6 hours as needed for shortness of breath or wheezing. Notify if symptoms persist despite rescue inhaler/neb use. Continue flonase 2 sprays each nostril daily   Repeat chest x ray in 4 weeks    Follow up in 4 weeks with Dr. Marchelle Gearing or [facility] and repeat CXR beforehand. If symptoms do not improve or worsen, please contact office for sooner follow up or seek emergency care

## 2022-12-11 NOTE — Assessment & Plan Note (Signed)
Resolved exacerbation. Clinically improved. CXR with 1 cm opacity in the RUL, likely infectious given never smoker and resolution of infectious symptoms with abx. Plan to repeat CXR in 4 weeks to ensure resolution. If this area persists, will obtain CT chest for further evaluation. She will continue ICS maintenance inhaler. Action plan in place.  Patient Instructions  Continue pulmicort 1 puff Twice daily. Brush tongue and rinse mouth afterwards Continue Albuterol inhaler 2 puffs every 6 hours as needed for shortness of breath or wheezing. Notify if symptoms persist despite rescue inhaler/neb use. Continue flonase 2 sprays each nostril daily   Repeat chest x ray in 4 weeks    Follow up in 4 weeks with Dr. Marchelle Gearing or [facility] and repeat CXR beforehand. If symptoms do not improve or worsen, please contact office for sooner follow up or seek emergency care

## 2022-12-24 ENCOUNTER — Other Ambulatory Visit (INDEPENDENT_AMBULATORY_CARE_PROVIDER_SITE_OTHER): Payer: Medicare Other

## 2022-12-24 DIAGNOSIS — E063 Autoimmune thyroiditis: Secondary | ICD-10-CM | POA: Diagnosis not present

## 2022-12-24 LAB — T4, FREE: Free T4: 1.03 ng/dL (ref 0.60–1.60)

## 2022-12-24 LAB — TSH: TSH: 3.57 u[IU]/mL (ref 0.35–5.50)

## 2022-12-26 ENCOUNTER — Other Ambulatory Visit: Payer: Self-pay | Admitting: Family Medicine

## 2022-12-26 DIAGNOSIS — E7800 Pure hypercholesterolemia, unspecified: Secondary | ICD-10-CM | POA: Diagnosis not present

## 2022-12-26 DIAGNOSIS — I10 Essential (primary) hypertension: Secondary | ICD-10-CM | POA: Diagnosis not present

## 2022-12-26 DIAGNOSIS — Z1231 Encounter for screening mammogram for malignant neoplasm of breast: Secondary | ICD-10-CM

## 2022-12-29 ENCOUNTER — Ambulatory Visit (INDEPENDENT_AMBULATORY_CARE_PROVIDER_SITE_OTHER): Payer: Medicare Other | Admitting: Endocrinology

## 2022-12-29 VITALS — BP 130/65 | HR 67 | Ht 60.0 in | Wt 134.6 lb

## 2022-12-29 DIAGNOSIS — E063 Autoimmune thyroiditis: Secondary | ICD-10-CM

## 2022-12-29 DIAGNOSIS — E7800 Pure hypercholesterolemia, unspecified: Secondary | ICD-10-CM | POA: Diagnosis not present

## 2022-12-29 DIAGNOSIS — M810 Age-related osteoporosis without current pathological fracture: Secondary | ICD-10-CM

## 2022-12-29 DIAGNOSIS — Z0000 Encounter for general adult medical examination without abnormal findings: Secondary | ICD-10-CM | POA: Diagnosis not present

## 2022-12-29 DIAGNOSIS — K219 Gastro-esophageal reflux disease without esophagitis: Secondary | ICD-10-CM | POA: Diagnosis not present

## 2022-12-29 DIAGNOSIS — J4540 Moderate persistent asthma, uncomplicated: Secondary | ICD-10-CM | POA: Diagnosis not present

## 2022-12-29 DIAGNOSIS — I10 Essential (primary) hypertension: Secondary | ICD-10-CM | POA: Diagnosis not present

## 2022-12-29 NOTE — Progress Notes (Signed)
Patient ID: Jasmine Velez, female   DOB: 1940-09-02, 82 y.o.   MRN: [id number]             Reason for Appointment:  Hypothyroidism, follow-up visit    History of Present Illness:   Hypothyroidism was first diagnosed in 09/2018  At the time of diagnosis patient was having routine lab work and TSH was 6.7 She did not have at that time any symptoms of fatigue, cold sensitivity or weight change Not clear if she had abnormal thyroid levels prior to that           The patient has been treated with either levothyroxine and Armour Thyroid When she first started supplementation in 3/20 she was tried on unknown dose of levothyroxine but she thinks it caused her to have more reflux symptoms and she was switched to Armour Thyroid 15 mg daily    With starting thyroid supplementation she did not feel any different  Subsequently her dosage has been generally about the same at 15 mg Armour Thyroid  She has history of getting mildly hyperthyroid if she takes a higher dose  RECENT history:  At her initial consultation in 06/2020 she was told not to take any thyroid supplements since she was felt to have subclinical hyperthyroidism with 30 mcg of Armour Thyroid She came back in 5/22 since she was having more symptoms of feeling more fatigued and needing to take a nap in the afternoon as well as being more sensitive to cold, also noticing some forgetfulness  Since she was symptomatic and her TSH was higher at 5.8 she was started back on Armour Thyroid 15 mg daily With this she started feeling back to normal Last visit was in 06/2022  Overall she feels fairly good and not any more fatigued than usual  Tends to have mild cold intolerance Weight is about the same or better  She has been regular with her Armour Thyroid regularly on empty stomach in the mornings  TSH is consistently around 3 now         Patient's weight history is as follows:  Wt Readings from Last 3 Encounters:  12/29/22  134 lb 9.6 oz (61.1 kg)  12/11/22 131 lb (59.4 kg)  11/24/22 134 lb (60.8 kg)    Thyroid function results have been as follows:  Lab Results  Component Value Date   TSH 3.57 12/24/2022   TSH 3.07 06/13/2022   TSH 3.65 10/04/2021   TSH 5.04 04/08/2021   FREET4 1.03 12/24/2022   FREET4 0.85 06/13/2022   FREET4 0.90 10/04/2021   FREET4 0.88 04/08/2021   T3FREE 3.2 04/08/2021   T3FREE 3.6 01/07/2021     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    Asthma    Cancer (HCC)    colon cancer    Cataract    removed both eyes    GERD (gastroesophageal reflux disease)    Hyperlipidemia    PUD (peptic ulcer disease)    25 yrs ago    Rosacea    Sciatica    UTI (lower urinary tract infection)     Past Surgical History:  Procedure Laterality Date   CATARACT EXTRACTION  2013, 2014   COLON RESECTION  2008   COLONOSCOPY     LASIK  2000   NASAL SEPTUM SURGERY  1980's   PILONIDAL CYST EXCISION  1960   TONSILLECTOMY  1960   TOTAL HIP ARTHROPLASTY  1994    Family History  Problem Relation Age of Onset   CAD Father    Dementia Mother    Dementia Sister    Colon polyps Neg Hx    Colon cancer Neg Hx    Rectal cancer Neg Hx    Stomach cancer Neg Hx    Thyroid disease Neg Hx    Esophageal cancer Neg Hx    Liver disease Neg Hx    Pancreatic cancer Neg Hx     Social History:  reports that she has never smoked. She has never used smokeless tobacco. She reports that she does not currently use alcohol. She reports that she does not use drugs.  Allergies:  Allergies  Allergen Reactions   Other Other (See Comments)    ALL peppers causes severe gastric distress per patient.  Paprika.    Tramadol Nausea And Vomiting   Alendronate Sodium     Other Reaction(s): stomach upset   Ciprofloxacin Other (See Comments)    Other Reaction(s): mouth/throat rash, GI issues   Fosamax [Alendronate] Nausea  Only    diarrhea   Levothyroxine Other (See Comments)    Other Reaction(s): GI issues   Nirmatrelvir-Ritonavir Diarrhea, Itching and Nausea Only   Amlodipine Nausea Only   Ceclor [Cefaclor] Nausea Only    Gerd   Erythromycin Rash    In mouth   Nexium [Esomeprazole Magnesium] Nausea Only    sick   Penicillins Rash    In her mouth   Statins Other (See Comments)    Muscle pain, nausea, reflux   Sulfa Antibiotics Rash    Allergies as of 12/29/2022       Reactions   Other Other (See Comments)   ALL peppers causes severe gastric distress per patient.  Paprika.    Tramadol Nausea And Vomiting   Alendronate Sodium    Other Reaction(s): stomach upset   Ciprofloxacin Other (See Comments)   Other Reaction(s): mouth/throat rash, GI issues   Fosamax [alendronate] Nausea Only   diarrhea   Levothyroxine Other (See Comments)   Other Reaction(s): GI issues   Nirmatrelvir-ritonavir Diarrhea, Itching, Nausea Only   Amlodipine Nausea Only   Ceclor [cefaclor] Nausea Only   Gerd   Erythromycin Rash   In mouth   Nexium [esomeprazole Magnesium] Nausea Only   sick   Penicillins Rash   In her mouth   Statins Other (See Comments)   Muscle pain, nausea, reflux   Sulfa Antibiotics Rash        Medication List        Accurate as of December 29, 2022  1:09 PM. If you have any questions, ask your nurse or doctor.          albuterol 108 (90 Base) MCG/ACT inhaler Commonly known as: ProAir HFA Inhale 1-2 puffs into the lungs every 6 (six) hours as needed for wheezing or shortness of breath.   Armour Thyroid 15 MG tablet Generic drug: thyroid TAKE 1 TABLET (15 MG TOTAL) BY MOUTH DAILY.   budesonide 180 MCG/ACT inhaler Commonly known as: PULMICORT Inhale 1 puff 2 (two) times daily into the lungs.   cholecalciferol 25 MCG (1000 UNIT) tablet Commonly known as: VITAMIN D3 2 tablets   fluticasone 50 MCG/ACT nasal spray Commonly known as: FLONASE SPRAY 2 SPRAYS INTO EACH NOSTRIL EVERY  DAY   hydrochlorothiazide 25 MG tablet Commonly known as: HYDRODIURIL Take 25 mg by mouth daily as needed.  hydrocortisone 2.5 % cream 1 application   multivitamin with minerals Tabs tablet Take 1 tablet by mouth daily.   pravastatin 20 MG tablet Commonly known as: PRAVACHOL Take 40 mg by mouth every other day.   RABEprazole 20 MG tablet Commonly known as: ACIPHEX Take 20 mg by mouth as needed.           Review of Systems  She is taking HydroDIURIL as needed for edema of legs      OSTEOPOROSIS: Her last T-score was -2.5 as of 11/23 done for screening by PCP  Previously her height was 5'2" and her youth and now appears to have lost 2 inches However she has some lumbar spine issues also No history of fractures and last MRI of her spine did not show any compression deformities  She was tried on Fosamax but she says with that she feels very tired and a little nauseous and does not want to take this Her PCP wants her to look at Prolia as an option  Lab Results  Component Value Date   CALCIUM 9.7 06/20/2022    Currently taking a total of 3000 U VITAMIN D3 with therapeutic levels Recent calcium level done last week by PCP was 9.4 with normal renal function        Examination:    BP 130/65 (BP Location: Left Arm, Patient Position: Sitting, Cuff Size: Normal)   Pulse 67   Ht 5' (1.524 m)   Wt 134 lb 9.6 oz (61.1 kg)   SpO2 99%   BMI 26.29 kg/m   Thyroid not palpable No significant thoracic kyphosis Mild scoliosis of the lumbar spine  Assessment:  HYPOTHYROIDISM, mild with highest TSH 6.7, symptomatic No history of goiter  She has subjectively done well with 15 mg Armour Thyroid which she has taken long-term now She is consistent with taking this before breakfast Note that she reports GI intolerance to levothyroxine  TSH is very stable around 3   Osteoporosis: Not clear this is associated with height loss but does not have any associated fractures Since  she is intolerant to bisphosphonates orally not clear if she will tolerate Reclast infusion  PLAN:   Continue same dose of 15 mg Armour Thyroid   Discussed Prolia as an option and that it should be appropriate for her for long-term fracture risk reduction Will send an order to the infusion center and get the prior authorization done   Reather Littler 12/29/2022, 1:09 PM    Note: This office note was prepared with Dragon voice recognition system technology. Any transcriptional errors that result from this process are unintentional.

## 2023-01-12 ENCOUNTER — Telehealth: Payer: Self-pay | Admitting: Pharmacy Technician

## 2023-01-12 NOTE — Telephone Encounter (Signed)
Dr. Lucianne Muss, [facility] note:  Patient will be scheduled as soon as possible Auth Submission: APPROVED Site of care: Site of care: [facility] Payer: [employer] & medicare a/b Medication & CPT/J Code(s) submitted: Prolia (Denosumab) [id number] Route of submission (phone, fax, portal):  Phone # Fax # Auth type: Buy/Bill Units/visits requested: 2 Reference number: [id number]  Approval from: 01/09/23 to 01/08/24

## 2023-01-13 NOTE — Telephone Encounter (Signed)
Dr. Lucianne Muss, Yes, we will schedule all upcoming treatments moving forward.

## 2023-01-14 ENCOUNTER — Ambulatory Visit (INDEPENDENT_AMBULATORY_CARE_PROVIDER_SITE_OTHER): Payer: Medicare Other

## 2023-01-14 ENCOUNTER — Telehealth: Payer: Self-pay | Admitting: Nurse Practitioner

## 2023-01-14 ENCOUNTER — Ambulatory Visit (INDEPENDENT_AMBULATORY_CARE_PROVIDER_SITE_OTHER): Payer: Medicare Other | Admitting: Nurse Practitioner

## 2023-01-14 ENCOUNTER — Other Ambulatory Visit: Payer: Self-pay | Admitting: Nurse Practitioner

## 2023-01-14 ENCOUNTER — Encounter: Payer: Self-pay | Admitting: Nurse Practitioner

## 2023-01-14 ENCOUNTER — Encounter: Payer: Self-pay | Admitting: Anesthesiology

## 2023-01-14 VITALS — BP 118/70 | HR 66 | Ht 60.0 in | Wt 133.0 lb

## 2023-01-14 DIAGNOSIS — J4531 Mild persistent asthma with (acute) exacerbation: Secondary | ICD-10-CM

## 2023-01-14 DIAGNOSIS — R911 Solitary pulmonary nodule: Secondary | ICD-10-CM | POA: Insufficient documentation

## 2023-01-14 DIAGNOSIS — R918 Other nonspecific abnormal finding of lung field: Secondary | ICD-10-CM | POA: Diagnosis not present

## 2023-01-14 DIAGNOSIS — J4530 Mild persistent asthma, uncomplicated: Secondary | ICD-10-CM | POA: Diagnosis not present

## 2023-01-14 NOTE — Progress Notes (Signed)
@Patient  ID: Jasmine Velez, female    DOB: 06-26-1941, 82 y.o.   MRN: [id number]  Chief Complaint  Patient presents with   Follow-up    Pt states she is feeling better, it has all cleared up. No concerns    Referring provider: Shon Hale, *  HPI: 82 year old female, never smoker followed for mild persistent asthma.  She is a patient Jasmine Velez and last seen in office on 12/11/2022 by Jasmine Velez.  Past medical history significant for hypothyroidism, HLD, hypertension.  TEST/EVENTS:  08/11/2014: FVC 93, FEV1 80, ratio 65 11/24/2022 CXR: perihilar peribronchial thickening. 1 cm opacity overlies the RUL, raising the question of pulmonary nodule. Chronic S shaped scoliosis   11/16/2019: OV with Jasmine Velez.  Patient doing well overall.  Albuterol use is essentially nonexistent.  She did have some recent stressful events with the 14th anniversary of her first husband and 10th anniversary of meeting her late significant other but was feeling better.  Continued Pulmicort 1 puff twice daily and albuterol as needed.  01/08/2022: OV with Jasmine Pizzi Velez for overdue follow-up.  She has been feeling well since we saw her 2 years ago.  Breathing has been overall stable.  She has used her albuterol 1 or 2 times this month.  She has been dealing with some stressful life events as they just found out that her significant other has been diagnosed with bladder cancer and has been undergoing multiple different procedures.  She attributes this to her intermittent episodes, which has been a trigger for her in the past as well.  Otherwise, she has felt as though her breathing has been doing great.  She has not had any exacerbations requiring a visit to the ED or urgent care.  She has not been on any prednisone or antibiotics in the last year.  She continues on Pulmicort twice daily.   11/24/2022: OV with Jasmine Carreras Velez for acute visit. She has felt bad over the last week with what started as chest congestion and a cough.  Symptoms have worsened over the past few days. She is producing cream to yellow phlegm. Feels more short winded. Has noticed some occasional wheezing, more so at night. Denies any fevers, chills, hemoptysis, leg swelling, orthopnea. No known sick exposures. No sinus symptoms. Eating and drinking normally. She is using her rescue inhaler once a day. Taking robitussin DM, which does help some.   12/11/2022: OV with Jasmine Carattini Velez for follow up. She is feeling much better. Cough has resolved. She feels like her breathing is back to her normal. No longer wheezing. Feels like she's sleeping better at night now. She hasn't had to use her rescue inhaler over the past few days. No fevers, chills, hemoptysis. Eating and drinking well. Using pulmicort.   01/14/2023: Today - follow up Patient presents today for follow up. Feels back to her baseline. No issues with her breathing. She has not had any recurrence of wheezing or cough. She has not had to use her rescue inhaler. Denies any anorexia, weight loss, hemoptysis. Using pulmicort.   Allergies  Allergen Reactions   Other Other (See Comments)    ALL peppers causes severe gastric distress per patient.  Paprika.    Tramadol Nausea And Vomiting   Alendronate Sodium     Other Reaction(s): stomach upset   Ciprofloxacin Other (See Comments)    Other Reaction(s): mouth/throat rash, GI issues   Fosamax [Alendronate] Nausea Only    diarrhea   Levothyroxine Other (See Comments)  Other Reaction(s): GI issues   Nirmatrelvir-Ritonavir Diarrhea, Itching and Nausea Only   Amlodipine Nausea Only   Ceclor [Cefaclor] Nausea Only    Gerd   Erythromycin Rash    In mouth   Nexium [Esomeprazole Magnesium] Nausea Only    sick   Penicillins Rash    In her mouth   Statins Other (See Comments)    Muscle pain, nausea, reflux   Sulfa Antibiotics Rash    Immunization History  Administered Date(s) Administered   Influenza Split 04/13/2014   Influenza, High Dose Seasonal PF  05/11/2017, 05/14/2018, 04/09/2019, 03/28/2021   Influenza,inj,Quad PF,6+ Mos 04/14/2015   PFIZER(Purple Top)SARS-COV-2 Vaccination 08/04/2019, 08/25/2019, 04/03/2020, 10/13/2020   PNEUMOCOCCAL CONJUGATE-20 12/07/2020   Pfizer Covid-19 Vaccine Bivalent Booster 99yrs & up 03/28/2021   Pneumococcal Conjugate-13 08/11/2014   Pneumococcal Polysaccharide-23 05/18/2018   Td 12/05/2015    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 1 Inhaler 5   ARMOUR THYROID 15 MG tablet TAKE 1 TABLET (15 MG TOTAL) BY MOUTH DAILY. 90 tablet 1   budesonide (PULMICORT) 180 MCG/ACT inhaler Inhale 1 puff 2 (two) times daily into the lungs. 1 Inhaler 3   cholecalciferol (VITAMIN D) 25 MCG (1000 UNIT) tablet 2 tablets     fluticasone (FLONASE) 50 MCG/ACT nasal spray SPRAY 2 SPRAYS INTO EACH NOSTRIL EVERY DAY 16 mL 3   hydrochlorothiazide (HYDRODIURIL) 25 MG tablet Take 25 mg by mouth daily as needed.     hydrocortisone 2.5 % cream 1 application     Multiple Vitamin (MULTIVITAMIN WITH MINERALS) TABS tablet Take 1 tablet by mouth daily.     pravastatin (PRAVACHOL) 20 MG tablet Take 40 mg by mouth every other day.   12   RABEprazole (ACIPHEX) 20 MG tablet Take 20 mg by mouth as needed.      Facility-Administered Medications Prior  to Visit  Medication Dose Route Frequency Provider Last Rate Last Admin   methylPREDNISolone acetate (DEPO-MEDROL) injection 80 mg  80 mg Intramuscular Once Jasmine Velez, Jasmine Cola, Velez         Review of Systems:   Constitutional: No weight loss or gain, night sweats, fevers, chills, fatigue or lassitude HEENT: No headaches, difficulty swallowing, tooth/dental problems, or sore throat. No sneezing, itching, ear ache, nasal congestion, or post nasal drip CV:  No chest pain, orthopnea, PND, swelling in lower extremities, anasarca, dizziness, palpitations, syncope Resp: No shortness of breath with exertion; productive cough; wheezing. No hemoptysis. No chest wall deformity Skin: No rash, lesions, ulcerations MSK:  No joint pain or swelling.  No decreased range of motion.  No back pain. Neuro: No dizziness or  lightheadedness.  Psych: No depression or anxiety. Mood stable.     Physical Exam:  BP 118/70   Pulse 66   Ht 5' (1.524 m)   Wt 133 lb (60.3 kg)   SpO2 98%   BMI 25.97 kg/m   GEN: Pleasant, interactive, well-kempt; non-toxic and in no acute distress. HEENT:  Normocephalic and atraumatic. PERRLA. Sclera white. Nasal turbinates pink, moist and patent bilaterally. No rhinorrhea present. Oropharynx pink and moist, without exudate or edema. No lesions, ulcerations, or postnasal drip.  NECK:  Supple w/ fair ROM.  CV: RRR, no m/r/g, no peripheral edema. Pulses intact, +2 bilaterally. No cyanosis, pallor or clubbing. PULMONARY:  Unlabored, regular breathing. Clear bilaterally A&P w/o wheezes/rales/rhonchi. No accessory muscle use. No dullness to percussion. GI: BS present and normoactive. Soft, non-tender to palpation.  MSK: No erythema, warmth or tenderness. Cap refil <2 sec all extrem. No deformities or joint swelling noted.  Neuro: A/Ox3. No focal deficits noted.   Skin: Warm, no lesions or rashe Psych: Normal affect and behavior. Judgement and thought content appropriate.     Lab  Results:  CBC No results found for: "WBC", "RBC", "HGB", "HCT", "PLT", "MCV", "MCH", "MCHC", "RDW", "LYMPHSABS", "MONOABS", "EOSABS", "BASOSABS"  BMET    Component Value Date/Time   NA 141 06/20/2022 1155   K 3.9 06/20/2022 1155   CL 104 06/20/2022 1155   CO2 31 06/20/2022 1155   GLUCOSE 76 06/20/2022 1155   BUN 18 06/20/2022 1155   CREATININE 0.69 06/20/2022 1155   CALCIUM 9.7 06/20/2022 1155    BNP No results found for: "BNP"   Imaging:  DG Chest 2 View  Result Date: 01/14/2023 CLINICAL DATA:  Follow-up right upper lobe opacity EXAM: CHEST - 2 VIEW COMPARISON:  Chest x-ray Nov 24, 2022 FINDINGS: The cardiomediastinal silhouette is unchanged in contour. Hyperinflated lungs in bilateral cystic airspaces, consistent with emphysema. Persistent possible 1 cm nodular opacity in the right upper lobe. No new focal pulmonary opacity. No pleural effusion or pneumothorax. The visualized upper abdomen is unremarkable. No acute osseous abnormality. IMPRESSION: Persistent possible 1 cm nodular opacity in the right upper lobe. Recommend further assessment with chest CT without contrast. Electronically Signed   By: Jacob Moores M.D.   On: 01/14/2023 15:16          No data to display          Lab Results  Component Value Date   NITRICOXIDE 17 05/18/2018        Assessment & Plan:   Mild persistent asthma Compensated on current regimen. Clinically improved since being treated for acute exacerbation 11/24/2022. Action plan in place.  Patient Instructions  Continue pulmicort 1 puff Twice daily. Brush tongue and rinse mouth afterwards Continue Albuterol inhaler 2 puffs every 6 hours as needed for shortness of breath or wheezing. Notify if symptoms persist despite rescue inhaler/neb use. Continue flonase 2 sprays each nostril daily    CT chest ordered for further evaluation    Follow up in 2-4 weeks to review CT scan results with Jasmine Velez or Jasmine Velez. If symptoms do  not improve or worsen, please contact office for sooner follow up or seek emergency care   Lung nodule Identified on plain films from 5/13. Having acute symptoms at the time so was treated for superimposed infectious process with clinical improvement. She had repeat CXR today which shows persistent 1 cm opacity in RUL. Will obtain CT imaging for further evaluation - orders placed today.  I spent 32 minutes of dedicated to the care of this patient on the date of this encounter to include pre-visit review of records, face-to-face time with the patient discussing conditions above, post visit ordering of testing, clinical documentation with the electronic health record, making appropriate referrals as documented, and communicating necessary findings to members of the patients care team.  Jasmine Chapel, Velez 01/14/2023  Pt aware and understands Velez's role.

## 2023-01-14 NOTE — Telephone Encounter (Signed)
Tiffany calling with call report. For chest xray. Tiffany phone number is [phone]. 

## 2023-01-14 NOTE — Patient Instructions (Signed)
Continue pulmicort 1 puff Twice daily. Brush tongue and rinse mouth afterwards Continue Albuterol inhaler 2 puffs every 6 hours as needed for shortness of breath or wheezing. Notify if symptoms persist despite rescue inhaler/neb use. Continue flonase 2 sprays each nostril daily    CT chest ordered for further evaluation    Follow up in 2-4 weeks to review CT scan results with Dr. Marchelle Gearing or Philis Nettle. If symptoms do not improve or worsen, please contact office for sooner follow up or seek emergency care

## 2023-01-14 NOTE — Telephone Encounter (Signed)
Called and spoke with Liz Beach at [facility]. Confirmed that we could see the report in her chart. CT scan has already been ordered.   Nothing further needed.

## 2023-01-14 NOTE — Assessment & Plan Note (Addendum)
Identified on plain films from 5/13. Having acute symptoms at the time so was treated for superimposed infectious process with clinical improvement. She had repeat CXR today which shows persistent 1 cm opacity in RUL. Will obtain CT imaging for further evaluation - orders placed today.

## 2023-01-14 NOTE — Assessment & Plan Note (Signed)
Compensated on current regimen. Clinically improved since being treated for acute exacerbation 11/24/2022. Action plan in place.  Patient Instructions  Continue pulmicort 1 puff Twice daily. Brush tongue and rinse mouth afterwards Continue Albuterol inhaler 2 puffs every 6 hours as needed for shortness of breath or wheezing. Notify if symptoms persist despite rescue inhaler/neb use. Continue flonase 2 sprays each nostril daily    CT chest ordered for further evaluation    Follow up in 2-4 weeks to review CT scan results with Dr. Marchelle Gearing or Philis Nettle. If symptoms do not improve or worsen, please contact office for sooner follow up or seek emergency care

## 2023-01-15 LAB — BASIC METABOLIC PANEL
BUN: 20 mg/dL (ref 7–25)
CO2: 29 mmol/L (ref 20–32)
Calcium: 9.6 mg/dL (ref 8.6–10.4)
Chloride: 100 mmol/L (ref 98–110)
Creat: 0.83 mg/dL (ref 0.60–0.95)
Glucose, Bld: 85 mg/dL (ref 65–99)
Potassium: 4.2 mmol/L (ref 3.5–5.3)
Sodium: 136 mmol/L (ref 135–146)

## 2023-01-19 ENCOUNTER — Encounter (INDEPENDENT_AMBULATORY_CARE_PROVIDER_SITE_OTHER): Payer: Medicare Other | Admitting: Ophthalmology

## 2023-01-19 ENCOUNTER — Other Ambulatory Visit: Payer: Medicare Other

## 2023-01-19 DIAGNOSIS — H43813 Vitreous degeneration, bilateral: Secondary | ICD-10-CM

## 2023-01-19 DIAGNOSIS — H353132 Nonexudative age-related macular degeneration, bilateral, intermediate dry stage: Secondary | ICD-10-CM

## 2023-01-21 ENCOUNTER — Ambulatory Visit
Admission: RE | Admit: 2023-01-21 | Discharge: 2023-01-21 | Disposition: A | Discharge status: Home / Self Care | Payer: Medicare Other | Source: Ambulatory Visit | Attending: Nurse Practitioner | Admitting: Nurse Practitioner

## 2023-01-21 DIAGNOSIS — R911 Solitary pulmonary nodule: Secondary | ICD-10-CM

## 2023-01-21 MED ORDER — IOPAMIDOL (ISOVUE-300) INJECTION 61%
75.0000 mL | Freq: Once | INTRAVENOUS | Status: AC | PRN
  Administered 2023-01-21: 75 mL via INTRAVENOUS

## 2023-01-26 ENCOUNTER — Ambulatory Visit: Payer: Medicare Other

## 2023-01-26 VITALS — BP 157/66 | HR 63 | Temp 97.8°F | Resp 20 | Ht 60.0 in | Wt 136.4 lb

## 2023-01-26 DIAGNOSIS — M810 Age-related osteoporosis without current pathological fracture: Secondary | ICD-10-CM

## 2023-01-26 MED ORDER — DENOSUMAB 60 MG/ML ~~LOC~~ SOSY
60.0000 mg | PREFILLED_SYRINGE | Freq: Once | SUBCUTANEOUS | Status: AC
  Administered 2023-01-26: 60 mg via SUBCUTANEOUS
  Filled 2023-01-26: qty 1

## 2023-01-26 NOTE — Progress Notes (Signed)
Diagnosis: Osteoporosis  Provider:  Chilton Greathouse MD  Procedure: Injection  Prolia (Denosumab), Dose: 60 mg, Site: subcutaneous, Number of injections: 1  Post Care: Observation period completed  Discharge: Condition: Good, Destination: Home . AVS Provided  Performed by:  Rico Ala, LPN

## 2023-01-27 NOTE — Progress Notes (Signed)
No concerning mass or nodule on CT imaging. She does have some scarring in the RUL which does not need any dedicated follow up. Thanks.

## 2023-01-29 ENCOUNTER — Ambulatory Visit
Admission: RE | Admit: 2023-01-29 | Discharge: 2023-01-29 | Disposition: A | Discharge status: Home / Self Care | Payer: Medicare Other | Source: Ambulatory Visit | Attending: Family Medicine | Admitting: Family Medicine

## 2023-01-29 DIAGNOSIS — Z1231 Encounter for screening mammogram for malignant neoplasm of breast: Secondary | ICD-10-CM | POA: Diagnosis not present

## 2023-02-06 DIAGNOSIS — I2510 Atherosclerotic heart disease of native coronary artery without angina pectoris: Secondary | ICD-10-CM | POA: Diagnosis not present

## 2023-02-06 DIAGNOSIS — I10 Essential (primary) hypertension: Secondary | ICD-10-CM | POA: Diagnosis not present

## 2023-02-06 DIAGNOSIS — E7800 Pure hypercholesterolemia, unspecified: Secondary | ICD-10-CM | POA: Diagnosis not present

## 2023-02-10 DIAGNOSIS — G453 Amaurosis fugax: Secondary | ICD-10-CM | POA: Diagnosis not present

## 2023-02-11 ENCOUNTER — Other Ambulatory Visit (HOSPITAL_COMMUNITY): Payer: Self-pay | Admitting: Family Medicine

## 2023-02-11 ENCOUNTER — Other Ambulatory Visit: Payer: Self-pay | Admitting: Family Medicine

## 2023-02-11 ENCOUNTER — Ambulatory Visit (HOSPITAL_COMMUNITY)
Admission: RE | Admit: 2023-02-11 | Discharge: 2023-02-11 | Disposition: A | Discharge status: Home / Self Care | Payer: Medicare Other | Source: Ambulatory Visit | Attending: Family Medicine | Admitting: Family Medicine

## 2023-02-11 DIAGNOSIS — H5460 Unqualified visual loss, one eye, unspecified: Secondary | ICD-10-CM

## 2023-02-11 DIAGNOSIS — H547 Unspecified visual loss: Secondary | ICD-10-CM | POA: Diagnosis not present

## 2023-02-11 DIAGNOSIS — I6782 Cerebral ischemia: Secondary | ICD-10-CM | POA: Diagnosis not present

## 2023-02-11 MED ORDER — GADOBUTROL 1 MMOL/ML IV SOLN
6.0000 mL | Freq: Once | INTRAVENOUS | Status: AC | PRN
  Administered 2023-02-11: 6 mL via INTRAVENOUS

## 2023-02-12 ENCOUNTER — Other Ambulatory Visit: Payer: Self-pay | Admitting: Endocrinology

## 2023-02-17 ENCOUNTER — Ambulatory Visit
Admission: RE | Admit: 2023-02-17 | Discharge: 2023-02-17 | Disposition: A | Discharge status: Home / Self Care | Payer: Medicare Other | Source: Ambulatory Visit | Attending: Family Medicine | Admitting: Family Medicine

## 2023-02-17 DIAGNOSIS — H5460 Unqualified visual loss, one eye, unspecified: Secondary | ICD-10-CM

## 2023-02-17 DIAGNOSIS — Z136 Encounter for screening for cardiovascular disorders: Secondary | ICD-10-CM | POA: Diagnosis not present

## 2023-02-18 DIAGNOSIS — G453 Amaurosis fugax: Secondary | ICD-10-CM | POA: Diagnosis not present

## 2023-02-18 DIAGNOSIS — I10 Essential (primary) hypertension: Secondary | ICD-10-CM | POA: Diagnosis not present

## 2023-02-25 ENCOUNTER — Encounter: Payer: Self-pay | Admitting: Neurology

## 2023-02-25 ENCOUNTER — Ambulatory Visit: Payer: Medicare Other | Admitting: Nurse Practitioner

## 2023-02-25 ENCOUNTER — Encounter: Payer: Self-pay | Admitting: Nurse Practitioner

## 2023-02-25 ENCOUNTER — Ambulatory Visit (INDEPENDENT_AMBULATORY_CARE_PROVIDER_SITE_OTHER): Payer: Medicare Other | Admitting: Neurology

## 2023-02-25 ENCOUNTER — Telehealth: Payer: Self-pay | Admitting: Neurology

## 2023-02-25 VITALS — BP 136/50 | HR 70 | Resp 16 | Ht 60.0 in

## 2023-02-25 VITALS — BP 124/74 | HR 74 | Ht 60.0 in | Wt 135.0 lb

## 2023-02-25 DIAGNOSIS — G453 Amaurosis fugax: Secondary | ICD-10-CM

## 2023-02-25 DIAGNOSIS — J4530 Mild persistent asthma, uncomplicated: Secondary | ICD-10-CM | POA: Diagnosis not present

## 2023-02-25 DIAGNOSIS — R269 Unspecified abnormalities of gait and mobility: Secondary | ICD-10-CM | POA: Insufficient documentation

## 2023-02-25 DIAGNOSIS — R911 Solitary pulmonary nodule: Secondary | ICD-10-CM

## 2023-02-25 NOTE — Patient Instructions (Signed)
Continue pulmicort 1 puff Twice daily. Brush tongue and rinse mouth afterwards Continue Albuterol inhaler 2 puffs every 6 hours as needed for shortness of breath or wheezing. Notify if symptoms persist despite rescue inhaler/neb use. Continue flonase 2 sprays each nostril daily    Follow up in 4 months with Dr. Marchelle Gearing. If symptoms do not improve or worsen, please contact office for sooner follow up or seek emergency care

## 2023-02-25 NOTE — Telephone Encounter (Signed)
sent to GI they obtain Yisroel Ramming [facility] [id number]

## 2023-02-25 NOTE — Progress Notes (Signed)
@Patient  ID: Jasmine Velez, female    DOB: May 10, 1941, 82 y.o.   MRN: [id number]  Chief Complaint  Patient presents with   Follow-up    Breathing is overall doing well and no new co's.     Referring provider: Shon Hale, *  HPI: 82 year old female, never smoker followed for mild persistent asthma.  She is a patient Dr. Jane Canary and last seen in office on 12/11/2022 by [facility] NP.  Past medical history significant for hypothyroidism, HLD, hypertension.  TEST/EVENTS:  08/11/2014: FVC 93, FEV1 80, ratio 65 11/24/2022 CXR: perihilar peribronchial thickening. 1 cm opacity overlies the RUL, raising the question of pulmonary nodule. Chronic S shaped scoliosis  01/21/2023 CT chest with contrast: Atherosclerosis.  No LAD.  There is no identified right upper lobe nodule in the area of concern on prior chest x-rays.  There is an area of slight pleural thickening in the right upper lobe, which likely corresponds to findings on chest x-ray.  This is likely scarring.  3 mm calcified nodule at the right lung base in the right middle lobe, likely granuloma.  11/16/2019: OV with Dr. Marchelle Gearing.  Patient doing well overall.  Albuterol use is essentially nonexistent.  She did have some recent stressful events with the 14th anniversary of her first husband and 10th anniversary of meeting her late significant other but was feeling better.  Continued Pulmicort 1 puff twice daily and albuterol as needed.  01/08/2022: OV with Avantae Bither NP for overdue follow-up.  She has been feeling well since we saw her 2 years ago.  Breathing has been overall stable.  She has used her albuterol 1 or 2 times this month.  She has been dealing with some stressful life events as they just found out that her significant other has been diagnosed with bladder cancer and has been undergoing multiple different procedures.  She attributes this to her intermittent episodes, which has been a trigger for her in the past as well.  Otherwise, she  has felt as though her breathing has been doing great.  She has not had any exacerbations requiring a visit to the ED or urgent care.  She has not been on any prednisone or antibiotics in the last year.  She continues on Pulmicort twice daily.   11/24/2022: OV with Keston Seever NP for acute visit. She has felt bad over the last week with what started as chest congestion and a cough. Symptoms have worsened over the past few days. She is producing cream to yellow phlegm. Feels more short winded. Has noticed some occasional wheezing, more so at night. Denies any fevers, chills, hemoptysis, leg swelling, orthopnea. No known sick exposures. No sinus symptoms. Eating and drinking normally. She is using her rescue inhaler once a day. Taking robitussin DM, which does help some.   12/11/2022: OV with Aaden Buckman NP for follow up. She is feeling much better. Cough has resolved. She feels like her breathing is back to her normal. No longer wheezing. Feels like she's sleeping better at night now. She hasn't had to use her rescue inhaler over the past few days. No fevers, chills, hemoptysis. Eating and drinking well. Using pulmicort.   01/14/2023: OV with Genever Hentges NP for follow up. Feels back to her baseline. No issues with her breathing. She has not had any recurrence of wheezing or cough. She has not had to use her rescue inhaler. Denies any anorexia, weight loss, hemoptysis. Using pulmicort.   02/25/2023: Today - follow up Patient presents today for  follow-up to discuss CT scan results which did not show any evidence of concerning lung mass or nodule.  She did have some scarring in the right lung which does not need any dedicated follow-up.  She is doing well.  Cough has not returned.  Feels like her breathing is at her baseline.  She has been having some trouble with a curtain sensation over her right eye that comes and goes.  She has been evaluated by her ophthalmologist as well as neurology.  She had a normal carotid artery scan for her  age as well as MRI of the brain.  She has follow-up scheduled with them.  No current symptoms today.  Denies any headaches, visual changes, lightheadedness or dizziness.  Allergies  Allergen Reactions   Other Other (See Comments)    ALL peppers causes severe gastric distress per patient.  Paprika.    Tramadol Nausea And Vomiting   Alendronate Sodium     Other Reaction(s): stomach upset   Ciprofloxacin Other (See Comments)    Other Reaction(s): mouth/throat rash, GI issues   Fosamax [Alendronate] Nausea Only    diarrhea   Levothyroxine Other (See Comments)    Other Reaction(s): GI issues   Nirmatrelvir-Ritonavir Diarrhea, Itching and Nausea Only   Amlodipine Nausea Only   Ceclor [Cefaclor] Nausea Only    Gerd   Erythromycin Rash    In mouth   Nexium [Esomeprazole Magnesium] Nausea Only    sick   Penicillins Rash    In her mouth   Statins Other (See Comments)    Muscle pain, nausea, reflux   Sulfa Antibiotics Rash    Immunization History  Administered Date(s) Administered   Influenza Split 04/13/2014   Influenza, High Dose Seasonal PF 05/11/2017, 05/14/2018, 04/09/2019, 03/28/2021   Influenza,inj,Quad PF,6+ Mos 04/14/2015   PFIZER(Purple Top)SARS-COV-2 Vaccination 08/04/2019, 08/25/2019, 04/03/2020, 10/13/2020   PNEUMOCOCCAL CONJUGATE-20 12/07/2020   Pfizer Covid-19 Vaccine Bivalent Booster 47yrs & up 03/28/2021   Pneumococcal Conjugate-13 08/11/2014   Pneumococcal Polysaccharide-23 05/18/2018   Td 12/05/2015    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 1 Inhaler 5   Apoaequorin (PREVAGEN) 10 MG CAPS Take 1 tablet by mouth daily after breakfast.     ARMOUR THYROID 15 MG tablet TAKE 1 TABLET (15 MG TOTAL) BY MOUTH DAILY. 30 tablet 5   budesonide (PULMICORT) 180 MCG/ACT inhaler Inhale 1 puff 2 (two) times daily into the lungs. 1 Inhaler 3   CALCIUM PO Take 2 tablets by mouth daily.     cholecalciferol (VITAMIN D) 25 MCG (1000 UNIT) tablet 2 tablets     denosumab (PROLIA) 60 MG/ML  SOSY injection Inject 60 mg into the skin every 6 (six) months.     fluticasone (FLONASE) 50 MCG/ACT nasal spray SPRAY 2 SPRAYS INTO EACH NOSTRIL EVERY DAY 16 mL 3   hydrochlorothiazide (HYDRODIURIL) 25 MG tablet Take 25 mg by mouth daily.     hydrocortisone 2.5 % cream 1 application     metroNIDAZOLE (METROGEL) 0.75 % vaginal gel Place 1 Applicatorful vaginally 2 (two) times daily.     Multiple Vitamin (MULTIVITAMIN WITH MINERALS) TABS tablet Take 1 tablet by mouth daily.     pravastatin (PRAVACHOL) 20 MG tablet Take 40 mg by mouth daily.  12   RABEprazole (ACIPHEX) 20 MG tablet Take 20 mg by mouth as needed.      Facility-Administered Medications Prior to Visit  Medication Dose Route Frequency Provider Last Rate Last Admin   methylPREDNISolone acetate (DEPO-MEDROL) injection 80 mg  80 mg Intramuscular Once Ashvin Adelson, Ruby Cola, NP         Review of Systems:   Constitutional: No weight loss or gain, night sweats, fevers, chills, fatigue or lassitude HEENT: No headaches, difficulty swallowing, tooth/dental problems, or sore throat. No sneezing, itching, ear ache, nasal congestion, or post nasal drip CV:  No chest pain, orthopnea, PND, swelling in lower extremities, anasarca, dizziness,  palpitations, syncope Resp: No shortness of breath with exertion; productive cough; wheezing. No hemoptysis. No chest wall deformity Skin: No rash, lesions, ulcerations MSK:  No joint pain or swelling.  No decreased range of motion.  No back pain. Neuro: No dizziness or lightheadedness.  Psych: No depression or anxiety. Mood stable.     Physical Exam:  BP 124/74 (BP Location: Left Arm, Cuff Size: Normal)   Pulse 74   Ht 5' (1.524 m)   Wt 135 lb (61.2 kg)   SpO2 99% Comment: on RA  BMI 26.37 kg/m   GEN: Pleasant, interactive, well-kempt; non-toxic and in no acute distress. HEENT:  Normocephalic and atraumatic. PERRLA. Sclera white. Nasal turbinates pink, moist and patent bilaterally. No rhinorrhea present. Oropharynx pink and moist, without exudate or edema. No lesions, ulcerations, or postnasal drip.  NECK:  Supple w/ fair ROM.  CV: RRR, no m/r/g, no peripheral edema. Pulses intact, +2 bilaterally. No cyanosis, pallor or clubbing. PULMONARY:  Unlabored, regular breathing. Clear bilaterally A&P w/o wheezes/rales/rhonchi. No accessory muscle use. No dullness to percussion. GI: BS present and normoactive. Soft, non-tender to palpation.  MSK: No erythema, warmth or tenderness. Cap refil <2 sec all extrem. No deformities or joint swelling noted.  Neuro: A/Ox3. No focal deficits noted.   Skin: Warm, no lesions or rashe Psych: Normal affect and behavior. Judgement and thought content appropriate.     Lab Results:  CBC No results found for: "WBC", "RBC", "HGB", "HCT", "PLT", "MCV", "MCH", "MCHC", "RDW", "LYMPHSABS", "MONOABS", "EOSABS", "BASOSABS"  BMET    Component Value Date/Time   NA 136 01/14/2023 1539   K 4.2 01/14/2023 1539   CL 100 01/14/2023 1539   CO2 29 01/14/2023 1539   GLUCOSE 85 01/14/2023 1539   BUN 20 01/14/2023 1539   CREATININE 0.83 01/14/2023 1539   CALCIUM 9.6 01/14/2023 1539    BNP No results found for: "BNP"   Imaging:  ECHOCARDIOGRAM  COMPLETE  Result Date: 02/26/2023    ECHOCARDIOGRAM REPORT   Patient Name:   AZARYA BINDA  Date of Exam: 02/26/2023 Medical Rec #:  [id number]     Height:       60.0 in Accession #:    [id number]  Weight:       135.0 lb Date of Birth:  Mar 22, 1941    BSA:          1.579 m Patient Age:    81 years      BP:           124/74 mmHg Patient Gender: F             HR:           66 bpm. Exam Location:  [address] Procedure: 2D Echo, Cardiac Doppler, Color Doppler and Strain Analysis Indications:    G45.9 TIA  History:        Patient has no prior history of Echocardiogram examinations.                 TIA; Risk Factors:Hypertension and Dyslipidemia.  Sonographer:    Samule Ohm RDCS Referring Phys: [phone] Levert Feinstein IMPRESSIONS  1. Left ventricular ejection fraction, by estimation, is 55 to 60%. The left ventricle has normal function. The left ventricle has no regional wall motion abnormalities. Left ventricular diastolic parameters are indeterminate. The average left ventricular global longitudinal strain is -19.8 %. The global longitudinal strain is normal.  2. Right ventricular systolic function is normal. The right ventricular size is normal.  3. Left atrial size was mildly dilated.  4. Interatrial septum is hypermobile.  5. Mild mitral valve regurgitation.  6. The aortic valve is tricuspid. Aortic valve regurgitation is mild. Aortic valve sclerosis/calcification is present, without any evidence of aortic stenosis.  7. The inferior vena cava is normal in size with greater than 50% respiratory variability, suggesting right atrial pressure of 3 mmHg. FINDINGS  Left Ventricle: Left ventricular ejection fraction, by estimation, is 55 to 60%. The left ventricle has normal function. The left ventricle has no regional wall motion abnormalities. The average left ventricular global longitudinal strain is -19.8 %. The global longitudinal strain is normal. The left ventricular internal cavity size was normal in size. There is  no left ventricular hypertrophy. Left ventricular diastolic parameters are indeterminate. Right Ventricle: The right ventricular size is normal. Right vetricular wall thickness was not assessed. Right ventricular systolic function is normal. Left Atrium: Left atrial size was mildly dilated. Right Atrium: Right atrial size was normal in size. Pericardium: There is no evidence of pericardial effusion. Mitral Valve: There is mild thickening of the mitral valve leaflet(s). Mild mitral valve regurgitation. Tricuspid Valve: The tricuspid valve is normal in structure. Tricuspid valve regurgitation is trivial. Aortic Valve: The aortic valve is tricuspid. Aortic valve regurgitation is mild. Aortic regurgitation PHT measures 393 msec. Aortic valve sclerosis/calcification is present, without any evidence of aortic stenosis. Pulmonic Valve: The pulmonic valve was normal in structure. Pulmonic valve regurgitation is trivial. Aorta: The aortic root and ascending aorta are structurally normal, with no evidence of dilitation. Venous: The inferior vena cava is normal in size with greater than 50% respiratory variability, suggesting right atrial pressure of 3 mmHg. IAS/Shunts: No atrial level shunt detected by color flow Doppler.  LEFT VENTRICLE PLAX 2D LVIDd:         4.40 cm   Diastology LVIDs:         3.00 cm   LV e' medial:    7.83 cm/s LV PW:         1.10 cm   LV E/e' medial:  10.6 LV IVS:        1.00 cm   LV e' lateral:   7.29 cm/s LVOT diam:  1.90 cm   LV E/e' lateral: 11.4 LV SV:         66 LV SV Index:   42        2D Longitudinal Strain LVOT Area:     2.84 cm  2D Strain GLS Avg:     -19.8 %  RIGHT VENTRICLE             IVC RV S prime:     13.90 cm/s  IVC diam: 1.10 cm TAPSE (M-mode): 2.2 cm RVSP:           23.4 mmHg LEFT ATRIUM             Index        RIGHT ATRIUM           Index LA diam:        3.70 cm 2.34 cm/m   RA Pressure: 3.00 mmHg LA Vol (A2C):   67.4 ml 42.67 ml/m  RA Area:     16.00 cm LA Vol (A4C):   55.3 ml  35.01 ml/m  RA Volume:   41.80 ml  26.46 ml/m LA Biplane Vol: 61.8 ml 39.13 ml/m  AORTIC VALVE LVOT Vmax:   106.00 cm/s LVOT Vmean:  67.500 cm/s LVOT VTI:    0.233 m AI PHT:      393 msec  AORTA Ao Root diam: 2.90 cm Ao Asc diam:  2.90 cm MITRAL VALVE                TRICUSPID VALVE MV Area (PHT): 3.48 cm     TR Peak grad:   20.4 mmHg MV Decel Time: 218 msec     TR Vmax:        226.00 cm/s MV E velocity: 82.80 cm/s   Estimated RAP:  3.00 mmHg MV A velocity: 123.00 cm/s  RVSP:           23.4 mmHg MV E/A ratio:  0.67                             SHUNTS                             Systemic VTI:  0.23 m                             Systemic Diam: 1.90 cm Dietrich Pates MD Electronically signed by Dietrich Pates MD Signature Date/Time: 02/26/2023/11:37:42 AM    Final    US Carotid Bilateral  Result Date: 02/18/2023 CLINICAL DATA:  82 year old female with a history of vision loss EXAM: BILATERAL CAROTID DUPLEX ULTRASOUND TECHNIQUE: Wallace Cullens scale imaging, color Doppler and duplex ultrasound were performed of bilateral carotid and vertebral arteries in the neck. COMPARISON:  12/15/2016 FINDINGS: Criteria: Quantification of carotid stenosis is based on velocity parameters that correlate the residual internal carotid diameter with NASCET-based stenosis levels, using the diameter of the distal internal carotid lumen as the denominator for stenosis measurement. The following velocity measurements were obtained: RIGHT ICA:  Systolic 109 cm/sec, Diastolic 24 cm/sec CCA:  100 cm/sec SYSTOLIC ICA/CCA RATIO:  1.1 ECA:  87 cm/sec LEFT ICA:  Systolic 102 cm/sec, Diastolic 27 cm/sec CCA:  98 cm/sec SYSTOLIC ICA/CCA RATIO:  1.1 ECA:  93 cm/sec Right Brachial SBP: 194 Left Brachial SBP: 188 RIGHT CAROTID ARTERY: No significant calcifications of the right common carotid  artery. Intermediate waveform maintained. Moderate heterogeneous and partially calcified plaque at the right carotid bifurcation. No significant lumen shadowing. Low resistance  waveform of the right ICA. No significant tortuosity. RIGHT VERTEBRAL ARTERY: Antegrade flow with low resistance waveform. LEFT CAROTID ARTERY: No significant calcifications of the left common carotid artery. Intermediate waveform maintained. Moderate heterogeneous and partially calcified plaque at the left carotid bifurcation. No significant lumen shadowing. Low resistance waveform of the left ICA. No significant tortuosity. LEFT VERTEBRAL ARTERY:  Antegrade flow with low resistance waveform. IMPRESSION: Color duplex indicates moderate heterogeneous and calcified plaque, with no hemodynamically significant stenosis by duplex criteria in the extracranial cerebrovascular circulation. Signed, Yvone Neu. Miachel Roux, RPVI [facility] Electronically Signed   By: Gilmer Mor D.O.   On: 02/18/2023 11:16   MR BRAIN W WO CONTRAST  Result Date: 02/11/2023 CLINICAL DATA:  Monocular vision loss. EXAM: MRI HEAD WITHOUT AND WITH CONTRAST TECHNIQUE: Multiplanar, multiecho pulse sequences of the brain and surrounding structures were obtained without and with intravenous contrast. CONTRAST:  6mL GADAVIST GADOBUTROL 1 MMOL/ML IV SOLN COMPARISON:  03/17/2019 FINDINGS: Brain: Diffusion imaging does not show any acute or subacute infarction or other cause of restricted diffusion. No abnormality affects the brainstem or cerebellum. 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 Cerebral hemispheres show a few old small vessel infarctions of the white matter. These are minimally progressive since the study of 2020. No cortical or large vessel territory infarction. No mass lesion, hemorrhage, hydrocephalus or extra-axial collection. After contrast administration, no abnormal enhancement occurs. Vascular: Major vessels at the base of the brain show flow. Skull and upper cervical spine: Negative Sinuses/Orbits: Clear/normal.  Previous lens implants.  Other: None IMPRESSION: No acute finding. No specific abnormality seen to explain monocular vision loss. 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 Electronically Signed   By: Paulina Fusi M.D.   On: 02/11/2023 19:36   MM 3D SCREENING MAMMOGRAM BILATERAL BREAST  Result Date: 01/29/2023 CLINICAL DATA:  Screening. EXAM: DIGITAL SCREENING BILATERAL MAMMOGRAM WITH TOMOSYNTHESIS AND CAD TECHNIQUE: Bilateral screening digital craniocaudal and mediolateral oblique mammograms were obtained. Bilateral screening digital breast tomosynthesis was performed. The images were evaluated with computer-aided detection. COMPARISON:  Previous exam(s). ACR Breast Density Category b: There are scattered areas of fibroglandular density. FINDINGS: There are no findings suspicious for malignancy. IMPRESSION: No mammographic evidence of malignancy. A result letter of this screening mammogram will be mailed directly to the patient. RECOMMENDATION: Screening mammogram in one year. (Code:SM-B-01Y) BI-RADS CATEGORY  1: Negative. Electronically Signed   By: Romona Curls M.D.   On: 01/29/2023 16:21    denosumab (PROLIA) injection 60 mg     Date Action Dose Route User   01/26/2023 0931 Given 60 mg Subcutaneous (Left Arm) Salli Real R, LPN           No data to display          Lab Results  Component Value Date   NITRICOXIDE 17 05/18/2018        Assessment & Plan:   Mild persistent asthma Compensated on current regimen.  No recurrent exacerbations since spring 2024.  Maintained on ICS and as needed SABA.  Action plan in place.  Patient Instructions  Continue pulmicort 1 puff Twice daily. Brush tongue and rinse mouth afterwards Continue Albuterol inhaler 2 puffs every 6 hours as needed for shortness of breath or wheezing. Notify  if symptoms persist despite rescue inhaler/neb use. Continue flonase 2 sprays each  nostril daily    Follow up in 4 months with Dr. Marchelle Gearing. If symptoms do not improve or worsen, please contact office for sooner follow up or seek emergency care   Lung nodule CT imaging did not reveal any concerning nodules or masses.  She did have some scarring in the right lung, possibly related to a prior infections or inflammation.  No further follow-up is needed.  Amaurosis fugax No active symptoms today.  Follow-up with neurology as scheduled.    I spent 28 minutes of dedicated to the care of this patient on the date of this encounter to include pre-visit review of records, face-to-face time with the patient discussing conditions above, post visit ordering of testing, clinical documentation with the electronic health record, making appropriate referrals as documented, and communicating necessary findings to members of the patients care team.  Noemi Chapel, NP 02/27/2023  Pt aware and understands NP's role.

## 2023-02-25 NOTE — Progress Notes (Signed)
Chief Complaint  Patient presents with   New Patient (Initial Visit)    Rm15, alone Monocular vision loss: only lasted 5 mins, MRI and carotids pending, ? TIA      ASSESSMENT AND PLAN  ROSENDA HORWEDEL is a 82 y.o. 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   MEDICAL HISTORY:  EMERY DILELLA, is a 82 year old female, seen in request by her primary care from [facility] Dr. Chanetta Marshall, Meridee Score, for evaluation of loss of vision of right eye, initial evaluation was on February 25, 2023  I reviewed and summarized the referring note. PMHX. HLD HTN Left hip replacement Cataract surgery.  On February 09, 2023, she had a sudden onset right visual change, described a curtain dropping down from her right upper eye field, she looked Herself at the mirror, then noticed gradual receding of the right upper visual field deficit, episode lasted about 5 minutes, there is no other focal signs  She was seen by her ophthalmologist Dr. Dione Booze on February 10, 2023, concerning for amaurosis fugax, suggested workup, already had MRI of the brain on February 11, 2023, mild small vessel disease no acute abnormality  Ultrasound of carotid artery February 17, 2023, moderate heterogeneous and calcified plaque, with no hemodynamic significant stenosis.  Laboratory evaluation from [place] on February 11, 2023, normal CBC  hemoglobin of 12.2, LDL was reported to 146, normal TSH, free T4, ESR was slightly elevated 26  PHYSICAL EXAM:   Vitals:   02/25/23 0853  BP: (!) 136/50  Pulse: 70  Resp: 16  Height: 5' (1.524 m)   Body mass index is 26.64 kg/m.  PHYSICAL EXAMNIATION:  Gen: NAD, conversant, well nourised, well groomed                     Cardiovascular: Regular rate rhythm, no peripheral edema, warm, nontender. Eyes: Conjunctivae clear without exudates or hemorrhage Neck: Supple, no carotid bruits. Pulmonary: Clear to auscultation bilaterally   NEUROLOGICAL EXAM:  MENTAL STATUS: Speech/cognition: Awake, alert, oriented to history taking and casual conversation CRANIAL NERVES: CN II: Visual fields are full to confrontation. Pupils are round equal and briskly reactive to light. CN III, IV, VI: extraocular movement are normal. No ptosis. CN V: Facial sensation is intact to light touch CN VII: Face is symmetric with normal eye closure  CN VIII: Hearing is normal to causal conversation. CN IX, X: Phonation is normal. CN XI: Head turning and shoulder shrug are intact  MOTOR: There is no pronator drift of out-stretched arms. Muscle bulk and tone are normal. Muscle strength is normal.  REFLEXES: Reflexes are 2+ and symmetric at the biceps, triceps, knees, and ankles. Plantar responses are bilaterally  SENSORY: Intact to light touch, pinprick and vibratory sensation are intact in fingers and toes.  COORDINATION: There is no trunk or limb dysmetria noted.  GAIT/STANCE: Need push-up to get up from seated position, stiff, cautious  REVIEW OF SYSTEMS:  Full 14 system review of systems performed and notable only for as above All other review of systems were negative.   ALLERGIES: Allergies  Allergen Reactions   Other Other (See Comments)    ALL peppers causes severe gastric distress per patient.  Paprika.    Tramadol Nausea And Vomiting   Alendronate Sodium     Other Reaction(s):  stomach upset   Ciprofloxacin Other (See Comments)    Other Reaction(s): mouth/throat rash, GI issues   Fosamax [Alendronate] Nausea Only    diarrhea   Levothyroxine Other (See Comments)    Other Reaction(s): GI issues   Nirmatrelvir-Ritonavir Diarrhea, Itching and Nausea Only   Amlodipine Nausea Only   Ceclor [Cefaclor] Nausea Only    Gerd   Erythromycin Rash    In mouth   Nexium [Esomeprazole Magnesium] Nausea Only    sick   Penicillins Rash    In her mouth   Statins Other (See Comments)    Muscle pain, nausea, reflux   Sulfa Antibiotics Rash    HOME MEDICATIONS: Current Outpatient Medications  Medication Sig Dispense Refill   albuterol (PROAIR HFA) 108 (90 Base) MCG/ACT inhaler Inhale 1-2 puffs into the lungs every 6 (six) hours as needed for wheezing or shortness of breath. 1 Inhaler 5   Apoaequorin (PREVAGEN) 10 MG CAPS Take 1 tablet by mouth daily after breakfast.     ARMOUR THYROID 15 MG tablet TAKE 1 TABLET (15 MG TOTAL) BY MOUTH DAILY. 30 tablet 5   budesonide (PULMICORT) 180 MCG/ACT inhaler Inhale 1 puff 2 (two) times daily into the lungs. 1 Inhaler 3   cholecalciferol (VITAMIN D) 25 MCG (1000 UNIT) tablet 2 tablets     denosumab (PROLIA) 60 MG/ML SOSY injection Inject 60 mg into the skin every 6 (six) months.     fluticasone (FLONASE) 50 MCG/ACT nasal spray SPRAY 2 SPRAYS INTO EACH NOSTRIL EVERY DAY 16 mL 3   hydrochlorothiazide (HYDRODIURIL) 25 MG tablet Take 25 mg by mouth daily as needed.     hydrocortisone 2.5 % cream 1 application     metroNIDAZOLE (METROGEL) 0.75 % vaginal gel Place 1 Applicatorful vaginally 2 (two) times daily.     Multiple Vitamin (MULTIVITAMIN WITH MINERALS) TABS tablet Take 1 tablet by mouth daily.     pravastatin (PRAVACHOL) 20 MG tablet Take 40 mg by mouth every other day.   12   RABEprazole (ACIPHEX) 20 MG tablet Take 20 mg by mouth as needed.      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    Asthma    Cancer (HCC)    colon cancer    Cataract    removed both eyes    GERD (gastroesophageal reflux disease)    Hyperlipidemia    PUD (peptic ulcer disease)    25 yrs ago    Rosacea    Sciatica    UTI (lower urinary tract infection)     PAST SURGICAL HISTORY: Past Surgical History:  Procedure Laterality Date   CATARACT EXTRACTION  2013, 2014   COLON RESECTION  2008   COLONOSCOPY     LASIK  2000   NASAL  SEPTUM SURGERY  1980's   PILONIDAL CYST EXCISION  1960   TONSILLECTOMY  1960   TOTAL HIP ARTHROPLASTY  1994    FAMILY HISTORY: Family History  Problem Relation Age of Onset   CAD Father    Dementia Mother    Dementia Sister    Colon polyps Neg Hx    Colon cancer Neg Hx    Rectal cancer Neg Hx    Stomach cancer Neg Hx    Thyroid disease Neg Hx    Esophageal cancer Neg Hx    Liver disease Neg Hx    Pancreatic cancer Neg Hx     SOCIAL HISTORY: Social History   Socioeconomic History   Marital status: Significant Other    Spouse name: Not on file   Number of children: 1   Years of education: Not on file   Highest education level: Not on file  Occupational History   Occupation: retired  Tobacco Use   Smoking status: Never   Smokeless tobacco: Never  Vaping Use   Vaping status: Never Used  Substance and Sexual Activity   Alcohol use: Not Currently   Drug use: No   Sexual activity: Not Currently  Other Topics Concern   Not on file  Social History Narrative   Has a boyfriend, "sexually active" at 61 years old   Social Determinants of Health   Financial Resource Strain: Not on file  Food Insecurity: Not on file  Transportation Needs: Not on file  Physical Activity:  Not on file  Stress: Not on file  Social Connections: Not on file  Intimate Partner Violence: Not on file      Levert Feinstein, M.D. Ph.D.  [facility] [address] [place], [place] [address] Ph: [phone] Fax: [phone]  CC:  Shon Hale, MD [address] [place],  [place] [address]  Shon Hale, MD

## 2023-02-26 ENCOUNTER — Ambulatory Visit (HOSPITAL_COMMUNITY): Payer: Medicare Other | Attending: Internal Medicine

## 2023-02-26 DIAGNOSIS — G453 Amaurosis fugax: Secondary | ICD-10-CM | POA: Diagnosis not present

## 2023-02-26 LAB — ECHOCARDIOGRAM COMPLETE
Area-P 1/2: 3.48 cm2
P 1/2 time: 393 msec
S' Lateral: 3 cm

## 2023-02-27 ENCOUNTER — Encounter: Payer: Self-pay | Admitting: Gastroenterology

## 2023-02-27 ENCOUNTER — Encounter: Payer: Self-pay | Admitting: Nurse Practitioner

## 2023-02-27 ENCOUNTER — Ambulatory Visit (INDEPENDENT_AMBULATORY_CARE_PROVIDER_SITE_OTHER): Payer: Medicare Other | Admitting: Gastroenterology

## 2023-02-27 VITALS — BP 130/68 | HR 66 | Ht 60.0 in | Wt 135.8 lb

## 2023-02-27 DIAGNOSIS — K219 Gastro-esophageal reflux disease without esophagitis: Secondary | ICD-10-CM | POA: Diagnosis not present

## 2023-02-27 MED ORDER — RABEPRAZOLE SODIUM 20 MG PO TBEC: 20.0000 mg | DELAYED_RELEASE_TABLET | Freq: Every day | ORAL | 3 refills | Status: DC

## 2023-02-27 NOTE — Assessment & Plan Note (Signed)
Compensated on current regimen.  No recurrent exacerbations since spring 2024.  Maintained on ICS and as needed SABA.  Action plan in place.  Patient Instructions  Continue pulmicort 1 puff Twice daily. Brush tongue and rinse mouth afterwards Continue Albuterol inhaler 2 puffs every 6 hours as needed for shortness of breath or wheezing. Notify if symptoms persist despite rescue inhaler/neb use. Continue flonase 2 sprays each nostril daily    Follow up in 4 months with Dr. Marchelle Gearing. If symptoms do not improve or worsen, please contact office for sooner follow up or seek emergency care

## 2023-02-27 NOTE — Assessment & Plan Note (Signed)
CT imaging did not reveal any concerning nodules or masses.  She did have some scarring in the right lung, possibly related to a prior infections or inflammation.  No further follow-up is needed.

## 2023-02-27 NOTE — Patient Instructions (Signed)
We have sent the following medications to your pharmacy for you to pick up at your convenience: Aciphex 20 mg daily.   _______________________________________________________  If your blood pressure at your visit was 140/90 or greater, please contact your primary care physician to follow up on this.  _______________________________________________________  If you are age 84 or older, your body mass index should be between 23-30. Your Body mass index is 26.52 kg/m. If this is out of the aforementioned range listed, please consider follow up with your Primary Care Provider.  If you are age 4 or younger, your body mass index should be between 19-25. Your Body mass index is 26.52 kg/m. If this is out of the aformentioned range listed, please consider follow up with your Primary Care Provider.   ________________________________________________________  The [facility] providers would like to encourage you to use [facility] to communicate with providers for non-urgent requests or questions.  Due to long hold times on the telephone, sending your provider a message by [facility] may be a faster and more efficient way to get a response.  Please allow 48 business hours for a response.  Please remember that this is for non-urgent requests.  _______________________________________________________

## 2023-02-27 NOTE — Progress Notes (Signed)
02/27/2023 Jasmine Velez [id number] 05-Apr-1941   HISTORY OF PRESENT ILLNESS:  This is an 82 year old Caucasian female with a past medical history of colon cancer, reflux and PUD, known to Dr. Myrtie Neither.  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 No repeat routine surveillance recommended due to age. EGD on the same day for reflux with small hiatal hernia and otherwise normal.   She is here today with complaints of reflux.  She says that she has been having a lot of "gastric reflux ".  She reports some discomfort in her epigastrium.  She has only been using the Aciphex as needed intermittently.  When she was seen here last in 2022 she was put back on it daily for a month and then was told she could decrease back to as needed if it seemed to help.  No dysphagia.  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 She has never used smokeless tobacco. She reports that she does not currently use alcohol. She reports that she does not use drugs. family history includes CAD in her father; Dementia in her mother and sister. Allergies  Allergen Reactions   Other Other (See Comments)    ALL peppers causes severe gastric distress per patient.  Paprika.     Tramadol Nausea And Vomiting   Alendronate Sodium     Other Reaction(s): stomach upset   Ciprofloxacin Other (See Comments)    Other Reaction(s): mouth/throat rash, GI issues   Fosamax [Alendronate] Nausea Only    diarrhea   Levothyroxine Other (See Comments)    Other Reaction(s): GI issues   Nirmatrelvir-Ritonavir Diarrhea, Itching and Nausea Only   Amlodipine Nausea Only   Ceclor [Cefaclor] Nausea Only    Gerd   Erythromycin Rash    In mouth   Nexium [Esomeprazole Magnesium] Nausea Only    sick   Penicillins Rash    In her mouth   Statins Other (See Comments)    Muscle pain, nausea, reflux   Sulfa Antibiotics Rash      Outpatient Encounter Medications as of 02/27/2023  Medication Sig   albuterol (PROAIR HFA) 108 (90 Base) MCG/ACT inhaler Inhale 1-2 puffs into the lungs every 6 (six) hours as needed for wheezing or shortness of breath.   Apoaequorin (PREVAGEN) 10 MG CAPS Take 1 tablet by mouth daily after breakfast.   ARMOUR THYROID 15 MG tablet TAKE 1 TABLET (15 MG TOTAL) BY MOUTH DAILY.   budesonide (PULMICORT) 180 MCG/ACT inhaler Inhale 1 puff 2 (two) times daily  into the lungs.   CALCIUM PO Take 2 tablets by mouth daily.   cholecalciferol (VITAMIN D) 25 MCG (1000 UNIT) tablet 2 tablets   denosumab (PROLIA) 60 MG/ML SOSY injection Inject 60 mg into the skin every 6 (six) months.   fluticasone (FLONASE) 50 MCG/ACT nasal spray SPRAY 2 SPRAYS INTO EACH NOSTRIL EVERY DAY   hydrochlorothiazide (HYDRODIURIL) 25 MG tablet Take 25 mg by mouth daily.   hydrocortisone 2.5 % cream 1 application   metroNIDAZOLE (METROGEL) 0.75 % vaginal gel Place 1 Applicatorful vaginally 2 (two) times daily.   Multiple Vitamin (MULTIVITAMIN WITH MINERALS) TABS tablet Take 1 tablet by mouth daily.   pravastatin (PRAVACHOL) 20 MG tablet Take 40 mg by mouth daily.   RABEprazole (ACIPHEX) 20 MG tablet Take 20 mg by mouth as needed.    Facility-Administered Encounter Medications as of 02/27/2023   Medication   methylPREDNISolone acetate (DEPO-MEDROL) injection 80 mg    REVIEW OF SYSTEMS  : All other systems reviewed and negative except where noted in the History of Present Illness.   PHYSICAL EXAM: BP 130/68   Pulse 66   Ht 5' (1.524 m)   Wt 135 lb 12.8 oz (61.6 kg)   SpO2 91%   BMI 26.52 kg/m  General: Well developed white female in no acute distress Head: Normocephalic and atraumatic Eyes:  Sclerae anicteric, conjunctiva pink. Ears: Normal auditory acuity Lungs: Clear throughout to auscultation; no W/R/R. Heart: Regular rate and rhythm; no M/R/G. Abdomen: Soft, non-distended.  BS present.  Non-tender. Musculoskeletal: Symmetrical with no gross deformities  Skin: No lesions on visible extremities Extremities: No edema  Neurological: Alert oriented x 4, grossly non-focal Psychological:  Alert and cooperative. Normal mood and affect  ASSESSMENT AND PLAN: *GERD: Has only been taking her Aciphex 20 mg on an as-needed basis.  When she seen back in 2022 she was put back on it daily for a month and then was told she could decrease back to as needed if it seemed to help.  Suspect she will need to do the same thing again, go back on it daily and she may need to continue on it daily indefinitely.  Prescription sent to pharmacy. *History of colon cancer: Status post partial colectomy in 2008, last colonoscopy February 2019 was normal.    CC:  Shon Hale, *

## 2023-02-27 NOTE — Assessment & Plan Note (Addendum)
No active symptoms today.  Follow-up with neurology as scheduled.

## 2023-03-04 ENCOUNTER — Ambulatory Visit
Admission: RE | Admit: 2023-03-04 | Discharge: 2023-03-04 | Disposition: A | Discharge status: Home / Self Care | Payer: Medicare Other | Source: Ambulatory Visit | Attending: Neurology | Admitting: Neurology

## 2023-03-04 DIAGNOSIS — G453 Amaurosis fugax: Secondary | ICD-10-CM

## 2023-03-04 DIAGNOSIS — R269 Unspecified abnormalities of gait and mobility: Secondary | ICD-10-CM | POA: Diagnosis not present

## 2023-03-09 DIAGNOSIS — I10 Essential (primary) hypertension: Secondary | ICD-10-CM | POA: Diagnosis not present

## 2023-03-09 DIAGNOSIS — E7800 Pure hypercholesterolemia, unspecified: Secondary | ICD-10-CM | POA: Diagnosis not present

## 2023-03-09 DIAGNOSIS — G453 Amaurosis fugax: Secondary | ICD-10-CM | POA: Diagnosis not present

## 2023-03-10 ENCOUNTER — Encounter: Payer: Self-pay | Admitting: Endocrinology

## 2023-03-12 ENCOUNTER — Encounter: Payer: Self-pay | Admitting: Gastroenterology

## 2023-03-12 DIAGNOSIS — K219 Gastro-esophageal reflux disease without esophagitis: Secondary | ICD-10-CM | POA: Insufficient documentation

## 2023-03-12 NOTE — Progress Notes (Signed)
____________________________________________________________  Attending physician addendum:  Thank you for sending this case to me. I have reviewed the entire note and agree with the plan.   Henry Danis, MD  ____________________________________________________________  

## 2023-03-20 DIAGNOSIS — Z23 Encounter for immunization: Secondary | ICD-10-CM | POA: Diagnosis not present

## 2023-03-27 DIAGNOSIS — M25562 Pain in left knee: Secondary | ICD-10-CM | POA: Diagnosis not present

## 2023-04-06 DIAGNOSIS — M7989 Other specified soft tissue disorders: Secondary | ICD-10-CM | POA: Diagnosis not present

## 2023-04-06 DIAGNOSIS — I10 Essential (primary) hypertension: Secondary | ICD-10-CM | POA: Diagnosis not present

## 2023-04-14 DIAGNOSIS — G453 Amaurosis fugax: Secondary | ICD-10-CM | POA: Diagnosis not present

## 2023-04-16 DIAGNOSIS — M9902 Segmental and somatic dysfunction of thoracic region: Secondary | ICD-10-CM | POA: Diagnosis not present

## 2023-04-16 DIAGNOSIS — M5134 Other intervertebral disc degeneration, thoracic region: Secondary | ICD-10-CM | POA: Diagnosis not present

## 2023-04-21 DIAGNOSIS — H04123 Dry eye syndrome of bilateral lacrimal glands: Secondary | ICD-10-CM | POA: Diagnosis not present

## 2023-05-04 ENCOUNTER — Telehealth: Payer: Self-pay | Admitting: Internal Medicine

## 2023-05-04 NOTE — Telephone Encounter (Signed)
Patient states she was on ProAir which she no longer gets and is not taking Ventolin. She does not feel like Ventolin works as well as [employer] account planner and would like to know what other options she has? She would like Proventil if possible.

## 2023-05-04 NOTE — Telephone Encounter (Signed)
Preventil worked well for her in the past, she said.

## 2023-05-04 NOTE — Telephone Encounter (Signed)
PT used to use Proaire but was sent Ventolin last time.She researched and said [facility] is out of business.  She states it does not work as well for her and wants to speak to Dr. Elvera Lennox or Ms. Cobb about a better option. Please call @ [phone]

## 2023-05-04 NOTE — Telephone Encounter (Signed)
Advise her to call her insurance to see what albuterol inhalers are covered. If Proventil or ProAir are covered, ok to send prescription specifying these. Thanks.

## 2023-05-05 MED ORDER — ALBUTEROL SULFATE HFA 108 (90 BASE) MCG/ACT IN AERS: 2.0000 | INHALATION_SPRAY | Freq: Four times a day (QID) | RESPIRATORY_TRACT | 2 refills | Status: DC | PRN

## 2023-05-05 NOTE — Telephone Encounter (Signed)
PT States go ahead and call in Proventil   [facility] [address].

## 2023-05-05 NOTE — Telephone Encounter (Signed)
Patient notifed via vmail Proventil sent.

## 2023-05-29 ENCOUNTER — Encounter: Payer: Self-pay | Admitting: Gastroenterology

## 2023-05-29 ENCOUNTER — Ambulatory Visit (INDEPENDENT_AMBULATORY_CARE_PROVIDER_SITE_OTHER): Payer: Medicare Other | Admitting: Gastroenterology

## 2023-05-29 VITALS — BP 120/64 | HR 65 | Ht 60.0 in | Wt 132.4 lb

## 2023-05-29 DIAGNOSIS — K219 Gastro-esophageal reflux disease without esophagitis: Secondary | ICD-10-CM

## 2023-05-29 MED ORDER — RABEPRAZOLE SODIUM 20 MG PO TBEC: 20.0000 mg | DELAYED_RELEASE_TABLET | Freq: Every day | ORAL | 3 refills | Status: DC

## 2023-05-29 NOTE — Patient Instructions (Signed)
Resume daily Aciphex.   FDgard samples provided use as directed per box.   Follow up in a year or sooner if needed.  _______________________________________________________  If your blood pressure at your visit was 140/90 or greater, please contact your primary care physician to follow up on this.  _______________________________________________________  If you are age 84 or older, your body mass index should be between 23-30. Your Body mass index is 25.86 kg/m. If this is out of the aforementioned range listed, please consider follow up with your Primary Care Provider.  If you are age 84 or younger, your body mass index should be between 19-25. Your Body mass index is 25.86 kg/m. If this is out of the aformentioned range listed, please consider follow up with your Primary Care Provider.   ________________________________________________________  The [facility] providers would like to encourage you to use [facility] to communicate with providers for non-urgent requests or questions.  Due to long hold times on the telephone, sending your provider a message by [facility] may be a faster and more efficient way to get a response.  Please allow 48 business hours for a response.  Please remember that this is for non-urgent requests.  _______________________________________________________

## 2023-05-29 NOTE — Progress Notes (Signed)
05/29/2023 Jasmine Velez [id number] [birth year]   HISTORY OF PRESENT ILLNESS: This is an 82 year old female who is a patient Dr. Irving Burton.  She has past medical history of colon cancer, reflux and peptic ulcer disease.  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  No repeat routine surveillance recommended due to age.  EGD on the same day for reflux with small hiatal hernia and otherwise normal.  She is here today for follow-up of her reflux.  I saw her back in August and she had only been using her Aciphex on an as-needed basis.  I asked her to begin taking it daily.  She says that she did that for a month and was feeling well so went to every other day for a month and was still doing well so she went back to just using as needed.  Now for the past few weeks thinks have flared up again.  She has been under a lot more stress recently as her boyfriend had to have a cholecystectomy, and has had some issues regarding his bladder cancer, etc.  Also complaining of a lot of gas.   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 She has never used smokeless tobacco. She reports that she does not currently use alcohol. She reports that she does not use  drugs. family history includes CAD in her father; Dementia in her mother and sister. Allergies  Allergen Reactions   Other Other (See Comments)    ALL peppers causes severe gastric distress per patient.  Paprika.    Tramadol Nausea And Vomiting   Alendronate Sodium     Other Reaction(s): stomach upset   Ciprofloxacin Other (See Comments)    Other Reaction(s): mouth/throat rash, GI issues   Fosamax [Alendronate] Nausea Only    diarrhea   Levothyroxine Other (See Comments)    Other Reaction(s): GI issues   Nirmatrelvir-Ritonavir Diarrhea, Itching and Nausea Only   Amlodipine Nausea Only   Ceclor [Cefaclor] Nausea Only    Gerd   Erythromycin Rash    In mouth   Nexium [Esomeprazole Magnesium] Nausea Only    sick   Penicillins Rash    In her mouth   Statins Other (See Comments)    Muscle pain, nausea, reflux   Sulfa Antibiotics Rash      Outpatient Encounter Medications as of 05/29/2023  Medication Sig   Apoaequorin (PREVAGEN) 10 MG CAPS Take 1 tablet by mouth daily after breakfast.   ARMOUR THYROID 15 MG tablet TAKE 1 TABLET (  15 MG TOTAL) BY MOUTH DAILY.   budesonide (PULMICORT) 180 MCG/ACT inhaler Inhale 1 puff 2 (two) times daily into the lungs.   CALCIUM PO Take 2 tablets by mouth daily.   cholecalciferol (VITAMIN D) 25 MCG (1000 UNIT) tablet 2 tablets   denosumab (PROLIA) 60 MG/ML SOSY injection Inject 60 mg into the skin every 6 (six) months.   fluticasone (FLONASE) 50 MCG/ACT nasal spray SPRAY 2 SPRAYS INTO EACH NOSTRIL EVERY DAY   hydrochlorothiazide (HYDRODIURIL) 25 MG tablet Take 25 mg by mouth daily.   hydrocortisone 2.5 % cream 1 application   metroNIDAZOLE (METROGEL) 0.75 % vaginal gel Place 1 Applicatorful vaginally 2 (two) times daily.   Multiple Vitamin (MULTIVITAMIN WITH MINERALS) TABS tablet Take 1 tablet by mouth daily.   RABEprazole (ACIPHEX) 20 MG tablet Take 1 tablet (20 mg total) by mouth daily.   rosuvastatin (CRESTOR) 10 MG tablet Take 10 mg by mouth  daily.   albuterol (VENTOLIN HFA) 108 (90 Base) MCG/ACT inhaler Inhale 2 puffs into the lungs every 6 (six) hours as needed for wheezing or shortness of breath. (Patient not taking: Reported on 05/29/2023)   losartan (COZAAR) 25 MG tablet Take 25 mg by mouth daily. (Patient not taking: Reported on 05/29/2023)   [DISCONTINUED] pravastatin (PRAVACHOL) 20 MG tablet Take 40 mg by mouth daily.   Facility-Administered Encounter Medications as of 05/29/2023  Medication   methylPREDNISolone acetate (DEPO-MEDROL) injection 80 mg    REVIEW OF SYSTEMS  : All other systems reviewed and negative except where noted in the History of Present Illness.   PHYSICAL EXAM: BP 120/64   Pulse 65   Ht 5' (1.524 m)   Wt 132 lb 7 oz (60.1 kg)   BMI 25.86 kg/m  General: Well developed white female in no acute distress Head: Normocephalic and atraumatic Eyes:  Sclerae anicteric, conjunctiva pink. Ears: Normal auditory acuity Lungs: Clear throughout to auscultation; no W/R/R. Heart: Regular rate and rhythm; no M/R/G. Musculoskeletal: Symmetrical with no gross deformities  Skin: No lesions on visible extremities Extremities: No edema  Neurological: Alert oriented x 4, grossly non-focal Psychological:  Alert and cooperative. Normal mood and affect  ASSESSMENT AND PLAN: *GERD: Was improved on Aciphex 20 mg daily and even when she reduced down to every other day.  Then she discontinued it again and went to intermittent use and symptoms returned.  Will resume and continue at once daily for a while and then can maybe reduce to every other day but needs to continue it somewhat regularly.  Samples of FDgard given for gas. *History of colon cancer: Status post partial colectomy in 2008, last colonoscopy February 2019 was normal.   **Follow-up in a year for further refills or sooner if needed.   CC:  Shon Hale, *

## 2023-06-02 NOTE — Progress Notes (Signed)
____________________________________________________________  Attending physician addendum:  Thank you for sending this case to me. I have reviewed the entire note and agree with the plan.   Malyssa Maris Danis, MD  ____________________________________________________________  

## 2023-06-22 ENCOUNTER — Other Ambulatory Visit: Payer: Self-pay

## 2023-06-22 DIAGNOSIS — M810 Age-related osteoporosis without current pathological fracture: Secondary | ICD-10-CM

## 2023-06-22 DIAGNOSIS — E063 Autoimmune thyroiditis: Secondary | ICD-10-CM

## 2023-06-23 ENCOUNTER — Other Ambulatory Visit: Payer: Medicare Other

## 2023-06-23 DIAGNOSIS — E063 Autoimmune thyroiditis: Secondary | ICD-10-CM | POA: Diagnosis not present

## 2023-06-23 DIAGNOSIS — M810 Age-related osteoporosis without current pathological fracture: Secondary | ICD-10-CM | POA: Diagnosis not present

## 2023-06-24 LAB — BASIC METABOLIC PANEL
BUN: 23 mg/dL (ref 7–25)
CO2: 32 mmol/L (ref 20–32)
Calcium: 9.2 mg/dL (ref 8.6–10.4)
Chloride: 101 mmol/L (ref 98–110)
Creat: 0.75 mg/dL (ref 0.60–0.95)
Glucose, Bld: 82 mg/dL (ref 65–99)
Potassium: 3.9 mmol/L (ref 3.5–5.3)
Sodium: 139 mmol/L (ref 135–146)

## 2023-06-24 LAB — T4, FREE: Free T4: 1.1 ng/dL (ref 0.8–1.8)

## 2023-06-24 LAB — TSH: TSH: 4.14 m[IU]/L (ref 0.40–4.50)

## 2023-06-29 DIAGNOSIS — E7800 Pure hypercholesterolemia, unspecified: Secondary | ICD-10-CM | POA: Diagnosis not present

## 2023-06-30 ENCOUNTER — Encounter: Payer: Self-pay | Admitting: Endocrinology

## 2023-06-30 ENCOUNTER — Ambulatory Visit (INDEPENDENT_AMBULATORY_CARE_PROVIDER_SITE_OTHER): Payer: Medicare Other | Admitting: Endocrinology

## 2023-06-30 VITALS — BP 122/80 | HR 70 | Ht 60.0 in | Wt 135.6 lb

## 2023-06-30 DIAGNOSIS — E063 Autoimmune thyroiditis: Secondary | ICD-10-CM

## 2023-06-30 DIAGNOSIS — E7800 Pure hypercholesterolemia, unspecified: Secondary | ICD-10-CM | POA: Diagnosis not present

## 2023-06-30 DIAGNOSIS — I10 Essential (primary) hypertension: Secondary | ICD-10-CM | POA: Diagnosis not present

## 2023-06-30 DIAGNOSIS — J4540 Moderate persistent asthma, uncomplicated: Secondary | ICD-10-CM | POA: Diagnosis not present

## 2023-06-30 MED ORDER — THYROID 15 MG PO TABS: 15.0000 mg | ORAL_TABLET | Freq: Every day | ORAL | 3 refills | Status: DC

## 2023-06-30 NOTE — Progress Notes (Signed)
Outpatient Endocrinology Note [place] Shanoah Asbill, MD  06/30/23  Patient's Name: Jasmine Velez    DOB: 1940/12/06    MRN: [id number]  REASON OF VISIT: Follow-up for hypothyroidism  PCP: Shon Hale, MD  HISTORY OF PRESENT ILLNESS:   Jasmine Velez is a 82 y.o. old female with past medical history as listed below is presented for a follow up for hypothyroidism.   Pertinent Thyroid History: Patient was previously seen by Dr. Lucianne Muss and was last time seen in June 2024.  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  At the time of diagnosis TSH was 6.7.  She did not have symptoms of fatigue, cold intolerance or weight change.  She was treated with levothyroxine or Armour Thyroid in the past.  Initially started on levothyroxine, she thinks it caused her to have more reflux symptoms and she was switched to Armour Thyroid 15 mg daily.  Initial consultation with endocrinology in December 2021.  At initial consultation in December 2021 with endocrinology she was taking Armour Thyroid 30 mg daily, she had subclinical hypothyroidism and asked to stop Armour Thyroid.  In follow-up visit in May 2022 she had symptoms of fatigue, cold intolerance and forgetfulness discharge at that time was 5.8 and she was restarted on Armour Thyroid 15 mg daily.  She felt better after being on Armour Thyroid.  # Osteoporosis : On Prolia every 3-month.  Managed by primary care provider.  Interval history She has been taking Armour Thyroid in the morning before breakfast and reports compliance.  Denies palpitation or heat intolerance.  No new symptoms.  Feeling overall normal energy.  No constipation or cold intolerance.  She has been taking calcium and vitamin D supplement.  She is also on Prolia, currently being managed by primary care provider for osteoporosis.  Recent thyroid lab as follows normal.   Latest Reference Range & Units 06/23/23 09:27  TSH 0.40 - 4.50 mIU/L 4.14  T4,Free(Direct) 0.8 -  1.8 ng/dL 1.1    REVIEW OF SYSTEMS:  As per history of present illness.   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    Asthma    Cancer (HCC)    colon cancer    Cataract    removed both eyes    GERD (gastroesophageal reflux disease)    Hyperlipidemia    PUD (peptic ulcer disease)    25 yrs ago    Rosacea    Sciatica    UTI (lower urinary tract infection)     PAST SURGICAL HISTORY: Past Surgical History:  Procedure Laterality Date   CATARACT EXTRACTION  2013, 2014   COLON RESECTION  2008   COLONOSCOPY     LASIK  2000   NASAL SEPTUM SURGERY  1980's   PILONIDAL CYST EXCISION  1960   TONSILLECTOMY  1960   TOTAL HIP ARTHROPLASTY  1994    ALLERGIES: Allergies  Allergen Reactions   Other Other (See Comments)    ALL peppers causes severe gastric distress per patient.  Paprika.    Tramadol Nausea And Vomiting   Alendronate Sodium     Other Reaction(s): stomach upset   Ciprofloxacin Other (See Comments)    Other Reaction(s): mouth/throat rash, GI issues   Fosamax [Alendronate] Nausea Only    diarrhea   Levothyroxine Other (See Comments)    Other Reaction(s): GI issues  Nirmatrelvir-Ritonavir Diarrhea, Itching and Nausea Only   Amlodipine Nausea Only   Ceclor [Cefaclor] Nausea Only    Gerd   Erythromycin Rash    In mouth   Nexium [Esomeprazole Magnesium] Nausea Only    sick   Penicillins Rash    In her mouth   Statins Other (See Comments)    Muscle pain, nausea, reflux   Sulfa Antibiotics Rash    FAMILY HISTORY:  Family History  Problem Relation Age of Onset   CAD Father    Dementia Mother    Dementia Sister    Colon polyps Neg Hx    Colon cancer Neg Hx    Rectal cancer Neg Hx    Stomach cancer Neg Hx    Thyroid disease Neg Hx    Esophageal cancer Neg Hx    Liver disease Neg Hx    Pancreatic cancer Neg Hx     SOCIAL HISTORY: Social History    Socioeconomic History   Marital status: Significant Other    Spouse name: Not on file   Number of children: 1   Years of education: Not on file   Highest education level: Not on file  Occupational History   Occupation: retired  Tobacco Use   Smoking status: Never   Smokeless tobacco: Never  Vaping Use   Vaping status: Never Used  Substance and Sexual Activity   Alcohol use: Not Currently   Drug use: No   Sexual activity: Not Currently  Other Topics Concern   Not on file  Social History Narrative   Has a boyfriend, "sexually active" at 55 years old   Social Drivers of Corporate investment banker Strain: Not on file  Food Insecurity: Not on file  Transportation Needs: Not on file  Physical Activity: Not on file  Stress: Not on file  Social Connections: Not on file    MEDICATIONS:  Current Outpatient Medications  Medication Sig Dispense Refill   Apoaequorin (PREVAGEN) 10 MG CAPS Take 1 tablet by mouth daily after breakfast.     budesonide (PULMICORT) 180 MCG/ACT inhaler Inhale 1 puff 2 (two) times daily into the lungs. 1 Inhaler 3   CALCIUM PO Take 2 tablets by mouth daily.     cholecalciferol (VITAMIN D) 25 MCG (1000 UNIT) tablet 2 tablets     denosumab (PROLIA) 60 MG/ML SOSY injection Inject 60 mg into the skin every 6 (six) months.     fluticasone (FLONASE) 50 MCG/ACT nasal spray SPRAY 2 SPRAYS INTO EACH NOSTRIL EVERY DAY 16 mL 3   hydrochlorothiazide (HYDRODIURIL) 25 MG tablet Take 25 mg by mouth daily.     hydrocortisone 2.5 % cream 1 application     metroNIDAZOLE (METROGEL) 0.75 % vaginal gel Place 1 Applicatorful vaginally 2 (two) times daily.     Multiple Vitamin (MULTIVITAMIN WITH MINERALS) TABS tablet Take 1 tablet by mouth daily.     RABEprazole (ACIPHEX) 20 MG tablet Take 1 tablet (20 mg total) by mouth daily. 90 tablet 3   rosuvastatin (CRESTOR) 10 MG tablet Take 10 mg by mouth daily.     thyroid (ARMOUR THYROID) 15 MG tablet Take 1 tablet (15 mg total) by  mouth daily. 90 tablet 3   Current Facility-Administered Medications  Medication Dose Route Frequency Provider Last Rate Last Admin   methylPREDNISolone acetate (DEPO-MEDROL) injection 80 mg  80 mg Intramuscular Once Cobb, Ruby Cola, NP        PHYSICAL EXAM: Vitals:   06/30/23 1011  BP: 122/80  Pulse: 70  SpO2: 97%  Weight: 135 lb 9.6 oz (61.5 kg)  Height: 5' (1.524 m)   Body mass index is 26.48 kg/m.  Wt Readings from Last 3 Encounters:  06/30/23 135 lb 9.6 oz (61.5 kg)  05/29/23 132 lb 7 oz (60.1 kg)  02/27/23 135 lb 12.8 oz (61.6 kg)    General: Well developed, well nourished female in no apparent distress.  HEENT: AT/[place], no external lesions. Hearing intact to the spoken word Eyes: Conjunctiva clear and no icterus. Neck: Trachea midline, neck supple Abdomen: Soft, non tender, non distended Neurologic: Alert, oriented, normal speech, deep tendon biceps reflexes normal Extremities: No pedal pitting edema, no tremors of outstretched hands Skin: Warm, color good.  Psychiatric: Does not appear depressed or anxious  PERTINENT HISTORIC LABORATORY AND IMAGING STUDIES:  All pertinent laboratory results were reviewed. Please see HPI also for further details.   TSH  Date Value Ref Range Status  06/23/2023 4.14 0.40 - 4.50 mIU/L Final  12/24/2022 3.57 0.35 - 5.50 uIU/mL Final  06/13/2022 3.07 0.35 - 5.50 uIU/mL Final     ASSESSMENT / PLAN  1. Acquired autoimmune hypothyroidism    -Patient has hypothyroidism diagnosed in 2020.  Patient had history of GI intolerance with levothyroxine.  She has been on Armour Thyroid. -Discussed that Armour Thyroid can potentially cause increased bone loss compared to levothyroxine.  However due to GI intolerance with levothyroxine in the past we will continue on Armour Thyroid at this time.  As long as we keep thyroid function test in the normal range should be okay.  Plan: -Continue current dose of Armour Thyroid 15 mg  daily. -Endocrinology follow-up in 6 months, with thyroid lab TSH, free T4, free T3.  I would also like to check free T3 in next set of lab as she has been on Armour Thyroid.   Diagnoses and all orders for this visit:  Acquired autoimmune hypothyroidism -     T4, free -     T3, free -     TSH  Other orders -     thyroid (ARMOUR THYROID) 15 MG tablet; Take 1 tablet (15 mg total) by mouth daily.    DISPOSITION Follow up in clinic in 6 months suggested.  All questions answered and patient verbalized understanding of the plan.  [place] Mikele Sifuentes, MD [facility] Endocrinology [facility] [address] [place], [address] [place], [place] [address] Phone # [phone]  At least part of this note was generated using voice recognition software. Inadvertent word errors may have occurred, which were not recognized during the proofreading process.

## 2023-07-27 ENCOUNTER — Ambulatory Visit: Payer: Medicare Other | Admitting: Internal Medicine

## 2023-07-27 ENCOUNTER — Encounter: Payer: Self-pay | Admitting: Internal Medicine

## 2023-07-27 VITALS — BP 109/72 | HR 77 | Ht 60.0 in | Wt 133.5 lb

## 2023-07-27 DIAGNOSIS — J4540 Moderate persistent asthma, uncomplicated: Secondary | ICD-10-CM

## 2023-07-27 MED ORDER — BUDESONIDE 180 MCG/ACT IN AEPB: 1.0000 | INHALATION_SPRAY | Freq: Two times a day (BID) | RESPIRATORY_TRACT | 1 refills | Status: DC

## 2023-07-27 NOTE — Progress Notes (Signed)
 HPI  IOV 08/11/2014  Chief Complaint  Patient presents with   Pulmonary Consult    Pt referred by Dr. Luke for asthma.    83 year old female referred by Dr. Luke for asthma. Due to history of asthma for 30 years. She is a new [place]  resident. Asthma symptoms are made worse by not taking her inhalers and also by stress. She used to be followed by a Dr. Cordell in [place] . Asthma is associated with multiple medication allergies but no allergy to aspirin. She reports a diagnosis of asthma made 30 years ago. Since then has been on several inhalers but for the last few to several years has been on Pulmicort  Turbuhaler twice daily. Symptoms only mild intermittent with this regimen he did she uses albuterol  roughly every 6 weeks. In times of stress of bad weather she could use it a little bit more but never more than 2 times a week. No nocturnal symptoms. Last prednisone  use for an exacerbation was several years ago. Currently she is here due to establishing care with a pulmonologist and reports stable health. Review of vaccine history so shows that she likely had Pneumovax many years ago in [place] . She's not had her Prevnar yet. She is up-to-date with her flu shot for this season.  Of note, she has chronic postnasal drainage that is mild and associated chronic clearing of the throat/she also gives a history of stage I colon cancer many years ago status post resection and no chemotherapy  I do not see any x-ray in our system are done by Dr. Luke and review of his notes  Spirometry today is normal     11/12/2015 9 month follow up OV for asthma. ACQ Score = 1: ( Wheezing hardly any of the time the past week) She is doing well, no flares. Well maintained on current regimen. She is compliant with her Pulmicort  daily, and is using her [employer] as rescue.She is up to date of all vaccinations. She denies wheezing,but does notice with the weather changes she has some faint  rare wheezing. She denies chest pain, orthopnea, hemoptysis, leg or calf pain.   OV 05/18/2017  Chief Complaint  Patient presents with   Follow-up    Pt states that her asthma is under control and she is happy. Pt has seldom had to use her proventil  inhaler. Denies any SOB or CP. States that she has had a sinus cough with nasal drainage mainly in the morning.    Follow-up mild persistent asthma  I personally have not seen her in a long time. Last seen by my nurse partition of May 2017. Since then she's been compliant with her daily Pulmicort  if she says if she does not take her Pulmicort  in a few days she will get chest tightness and symptoms. Otherwise asthma control questionnaire as of now is 0.2 showing excellent control. The middle of the night she does not wake up from asthma. When she wakes up she does not have any symptoms she's not limited in her activities because of asthma. She has not used her rescue inhaler she has not wheezed. The main problem appears to be some mild left nasal blockage intermittently. She recollects having had sinus surgery/nasal surgery many decades ago in [place] . She is asking for refills. She is up-to-date with her flu shot.  FenO 05/18/2017\ - 22 ppb     OV 05/18/2018  Subjective:  Patient ID: Jasmine Velez, female , DOB: February 14, 1941 ,  age 44 y.o. , MRN: [id number] , ADDRESS: [address] [place] [address]   05/18/2018 -   Chief Complaint  Patient presents with   Follow-up    1 year rov asthma- pt states she is doing well, needs refills today on resp meds.      HPI Jasmine Velez 83 y.o. -1 year follow-up for mild persistent asthma maintained on inhaled steroids.  Overall she is doing well.  In June 2019 her boyfriend/significant other suffered a stroke so she has been driving to [place] and she is a little bit stressed.  For the last several weeks she has had slight increase in respiratory symptoms but not to the point that she feels she  needs antibiotic or prednisone .  Currently asthma control questionnaire is 0.4.  Is not waking up in the middle of the night with asthma symptoms but when she wakes up she has very mild symptoms but her activities are not limited but she does experience some shortness of breath very little and she is not wheezing at all.  She used albuterol  a few days ago.  She is up-to-date with the flu shot.  She needs a Pneumovax.  She is never had a chest x-ray with us .  Immunization record also shows she could benefit from the new shingles vaccine.  At this point in time she wants to hold off on any Z-Pak.  She wants refills on her inhalers.  She continues with nasal steroids.  Exam nitric oxide  at 17 ppb and normal     ROS    - per HPI    OV 11/16/2019  Subjective:  Patient ID: Jasmine Velez, female , DOB: 20-Mar-1941 , age 24 y.o. , MRN: [id number] , ADDRESS: [address] [place] [address]   11/16/2019 -   Chief Complaint  Patient presents with   Follow-up    uses proair  every once and a while.   Follow-up mild persistent asthma on Pulmicort   HPI Jasmine Velez 83 y.o. -last seen in November 2019.  After that in February 2020 her significant other passed away.  Today is the 14th death anniversary of her husband and the 10th anniversary of meeting her late significant other.  She says she is coping with the grief of this.  However her grief is appropriate.  Yesterday because of the remembrance days coming up today she did have some wheezing but otherwise asthma is fully well under control.  Albuterol  use is nonexistent.  No nocturnal awakening.   She is known to have carotid bruit and carotid stenosis.  2018 right carotid stenosis was between 50 and 69%.  She says she followed up with primary care office in October 2020 she had another carotid ultrasound and she has been reassured that the carotid stenosis less than 50%.  They are following this up.    IMPRESSION: COPD without acute  abnormality.     Electronically Signed   By: Oneil Devonshire M.D.   On: 05/30/2018 13:48      11/16/2019: OV with Dr. Geronimo.  Patient doing well overall.  Albuterol  use is essentially nonexistent.  She did have some recent stressful events with the 14th anniversary of her first husband and 10th anniversary of meeting her late significant other but was feeling better.  Continued Pulmicort  1 puff twice daily and albuterol  as needed.  01/08/2022: OV with Cobb NP for overdue follow-up.  She has been feeling well since we saw her 2 years ago.  Breathing  has been overall stable.  She has used her albuterol  1 or 2 times this month.  She has been dealing with some stressful life events as they just found out that her significant other has been diagnosed with bladder cancer and has been undergoing multiple different procedures.  She attributes this to her intermittent episodes, which has been a trigger for her in the past as well.  Otherwise, she has felt as though her breathing has been doing great.  She has not had any exacerbations requiring a visit to the ED or urgent care.  She has not been on any prednisone  or antibiotics in the last year.  She continues on Pulmicort  twice daily.   11/24/2022: OV with Cobb NP for acute visit. She has felt bad over the last week with what started as chest congestion and a cough. Symptoms have worsened over the past few days. She is producing cream to yellow phlegm. Feels more short winded. Has noticed some occasional wheezing, more so at night. Denies any fevers, chills, hemoptysis, leg swelling, orthopnea. No known sick exposures. No sinus symptoms. Eating and drinking normally. She is using her rescue inhaler once a day. Taking robitussin DM, which does help some.   12/11/2022: OV with Cobb NP for follow up. She is feeling much better. Cough has resolved. She feels like her breathing is back to her normal. No longer wheezing. Feels like she's sleeping better at night now.  She hasn't had to use her rescue inhaler over the past few days. No fevers, chills, hemoptysis. Eating and drinking well. Using pulmicort .   01/14/2023: OV with Cobb NP for follow up. Feels back to her baseline. No issues with her breathing. She has not had any recurrence of wheezing or cough. She has not had to use her rescue inhaler. Denies any anorexia, weight loss, hemoptysis. Using pulmicort .   02/25/2023: Today - follow up Patient presents today for follow-up to discuss CT scan results which did not show any evidence of concerning lung mass or nodule.  She did have some scarring in the right lung which does not need any dedicated follow-up.  She is doing well.  Cough has not returned.  Feels like her breathing is at her baseline.  She has been having some trouble with a curtain sensation over her right eye that comes and goes.  She has been evaluated by her ophthalmologist as well as neurology.  She had a normal carotid artery scan for her age as well as MRI of the brain.  She has follow-up scheduled with them.  No current symptoms today.  Denies any headaches, visual changes, lightheadedness or dizziness.  TEST/EVENTS:  08/11/2014: FVC 93, FEV1 80, ratio 65 11/24/2022 CXR: perihilar peribronchial thickening. 1 cm opacity overlies the RUL, raising the question of pulmonary nodule. Chronic S shaped scoliosis  01/21/2023 CT chest with contrast: Atherosclerosis.  No LAD.  There is no identified right upper lobe nodule in the area of concern on prior chest x-rays.  There is an area of slight pleural thickening in the right upper lobe, which likely corresponds to findings on chest x-ray.  This is likely scarring.  3 mm calcified nodule at the right lung base in the right middle lobe, likely granuloma.   OV 07/27/2023  Subjective:  Patient ID: Jasmine Velez, female , DOB: 1941-01-12 , age 1 y.o. , MRN: [id number] , ADDRESS: [address] [place] [address] PCP Chrystal Lamarr RAMAN, MD Patient  Care Team: Chrystal Lamarr RAMAN, MD  as PCP - General (Family Medicine)  This Provider for this visit: Treatment Team:  Attending Provider: Geronimo Amel, MD    07/27/2023 -   Chief Complaint  Patient presents with   Follow-up    Pt states she has been doing well. Its under control with her Pulmicort  and proair . Denies any other concerns      HPI LEITA LINDBLOOM 83 y.o. -returns for follow-up.  I personally have not seen her in nearly 4 years.  She is been seeing nurse practitioner Izetta Rouleau.  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  In 2020 her significant other Slater passed away.  Subsequently she is now in a committed relationship with another gentleman called Tom.  At some point in time they both will moved to friend's home.  Overall she is doing well.  She says recently refilled her Pulmicort  and as her give a 90-day supplies we did a 30-day supply which is proving to be expensive for her.  Otherwise asthma is stable cold air does bother her but she is able to manage with albuterol .  In spring 2024 she did have some back pain which Dr. Josepha gave her spinal injection of steroid and since then no problem she is doing water aerobics at [facility] well.  She says she is up-to-date with the respiratory vaccines   PFT      No data to display           Lungs/Pleura: No confluent airspace opacities or effusions. No suspicious pulmonary nodules. In particular, no right upper lobe nodule in the area of concern on prior chest x-rays. There is an area of slight pleural thickening in the right upper lobe in the area of concern which likely corresponds to the density on chest x-ray. This likely reflects scarring. 3 mm calcified nodule at the right lung base in the right middle lobe, likely granuloma.   Upper Abdomen: No acute findings.   Musculoskeletal: Chest wall soft tissues are unremarkable. No acute bony  abnormality.   IMPRESSION: No suspicious pulmonary nodule in the right upper lobe as questioned on prior chest x-ray.   Coronary artery disease.   Aortic Atherosclerosis (ICD10-I70.0).     Electronically Signed   By: Franky Crease M.D.   On: 01/27/2023 07:31  LAB RESULTS last 96 hours No results found.  LAB RESULTS last 90 days Recent Results (from the past 2160 hours)  Basic metabolic panel     Status: None   Collection Time: 06/23/23  9:27 AM  Result Value Ref Range   Glucose, Bld 82 65 - 99 mg/dL    Comment: .            Fasting reference interval .    BUN 23 7 - 25 mg/dL   Creat 9.24 9.39 - 9.04 mg/dL   BUN/Creatinine Ratio SEE NOTE: 6 - 22 (calc)    Comment:    Not Reported: BUN and Creatinine are within    reference range. .    Sodium 139 135 - 146 mmol/L   Potassium 3.9 3.5 - 5.3 mmol/L   Chloride 101 98 - 110 mmol/L   CO2 32 20 - 32 mmol/L   Calcium 9.2 8.6 - 10.4 mg/dL  T4, free     Status: None   Collection Time: 06/23/23  9:27 AM  Result Value Ref Range   Free T4 1.1 0.8 - 1.8 ng/dL  TSH  Status: None   Collection Time: 06/23/23  9:27 AM  Result Value Ref Range   TSH 4.14 0.40 - 4.50 mIU/L         has a past medical history of Allergy, Arthritis, Asthma, Cancer (HCC), Cataract, GERD (gastroesophageal reflux disease), Hyperlipidemia, PUD (peptic ulcer disease), Rosacea, Sciatica, and UTI (lower urinary tract infection).   reports that she has never smoked. She has never used smokeless tobacco.  Past Surgical History:  Procedure Laterality Date   CATARACT EXTRACTION  2013, 2014   COLON RESECTION  2008   COLONOSCOPY     LASIK  2000   NASAL SEPTUM SURGERY  1980's   PILONIDAL CYST EXCISION  1960   TONSILLECTOMY  1960   TOTAL HIP ARTHROPLASTY  1994    Allergies  Allergen Reactions   Other Other (See Comments)    ALL peppers causes severe gastric distress per patient.  Paprika.    Tramadol Nausea And Vomiting   Alendronate Sodium      Other Reaction(s): stomach upset   Ciprofloxacin Other (See Comments)    Other Reaction(s): mouth/throat rash, GI issues   Fosamax [Alendronate] Nausea Only    diarrhea   Levothyroxine Other (See Comments)    Other Reaction(s): GI issues   Nirmatrelvir-Ritonavir Diarrhea, Itching and Nausea Only   Amlodipine Nausea Only   Ceclor [Cefaclor] Nausea Only    Gerd   Erythromycin Rash    In mouth   Nexium [Esomeprazole Magnesium] Nausea Only    sick   Penicillins Rash    In her mouth   Statins Other (See Comments)    Muscle pain, nausea, reflux   Sulfa Antibiotics Rash    Immunization History  Administered Date(s) Administered   Influenza Split 04/13/2014   Influenza, High Dose Seasonal PF 05/11/2017, 05/14/2018, 04/09/2019, 03/28/2021   Influenza,inj,Quad PF,6+ Mos 04/14/2015   PFIZER(Purple Top)SARS-COV-2 Vaccination 08/04/2019, 08/25/2019, 04/03/2020, 10/13/2020   PNEUMOCOCCAL CONJUGATE-20 12/07/2020   Pfizer Covid-19 Vaccine Bivalent Booster 68yrs & up 03/28/2021   Pfizer(Comirnaty)Fall Seasonal Vaccine 12 years and older 07/28/2022   Pneumococcal Conjugate-13 08/11/2014   Pneumococcal Polysaccharide-23 05/18/2018   Td 12/05/2015    Family History  Problem Relation Age of Onset   CAD Father    Dementia Mother    Dementia Sister    Colon polyps Neg Hx    Colon cancer Neg Hx    Rectal cancer Neg Hx    Stomach cancer Neg Hx    Thyroid  disease Neg Hx    Esophageal cancer Neg Hx    Liver disease Neg Hx    Pancreatic cancer Neg Hx      Current Outpatient Medications:    Apoaequorin (PREVAGEN) 10 MG CAPS, Take 1 tablet by mouth daily after breakfast., Disp: , Rfl:    CALCIUM PO, Take 2 tablets by mouth daily., Disp: , Rfl:    cholecalciferol (VITAMIN D ) 25 MCG (1000 UNIT) tablet, 2 tablets, Disp: , Rfl:    denosumab  (PROLIA ) 60 MG/ML SOSY injection, Inject 60 mg into the skin every 6 (six) months., Disp: , Rfl:    fluticasone  (FLONASE ) 50 MCG/ACT nasal spray, SPRAY 2  SPRAYS INTO EACH NOSTRIL EVERY DAY, Disp: 16 mL, Rfl: 3   hydrochlorothiazide (HYDRODIURIL) 25 MG tablet, Take 25 mg by mouth daily., Disp: , Rfl:    hydrocortisone 2.5 % cream, 1 application, Disp: , Rfl:    Multiple Vitamin (MULTIVITAMIN WITH MINERALS) TABS tablet, Take 1 tablet by mouth daily., Disp: , Rfl:    RABEprazole  (  ACIPHEX ) 20 MG tablet, Take 1 tablet (20 mg total) by mouth daily., Disp: 90 tablet, Rfl: 3   rosuvastatin (CRESTOR) 10 MG tablet, Take 10 mg by mouth daily., Disp: , Rfl:    thyroid  (ARMOUR THYROID ) 15 MG tablet, Take 1 tablet (15 mg total) by mouth daily., Disp: 90 tablet, Rfl: 3   budesonide  (PULMICORT ) 180 MCG/ACT inhaler, Inhale 1 puff into the lungs 2 (two) times daily., Disp: 3 each, Rfl: 1  Current Facility-Administered Medications:    methylPREDNISolone  acetate (DEPO-MEDROL ) injection 80 mg, 80 mg, Intramuscular, Once, Cobb, Comer GAILS, NP      Objective:   Vitals:   07/27/23 1607  BP: 109/72  Pulse: 77  SpO2: 98%  Weight: 133 lb 8 oz (60.6 kg)  Height: 5' (1.524 m)    Estimated body mass index is 26.07 kg/m as calculated from the following:   Height as of this encounter: 5' (1.524 m).   Weight as of this encounter: 133 lb 8 oz (60.6 kg).  @WEIGHTCHANGE @  [employer]   07/27/23 1607  Weight: 133 lb 8 oz (60.6 kg)     Physical Exam   General: No distress. Looks well O2 at rest: no Cane present: no Sitting in wheel chair: no Frail: no Obese: no Neuro: Alert and Oriented x 3. GCS 15. Speech normal Psych: Pleasant Resp:  Barrel Chest - no.  Wheeze - no, Crackles - no, No overt respiratory distress CVS: Normal heart sounds. Murmurs - no Ext: Stigmata of Connective Tissue Disease - no HEENT: Normal upper airway. PEERL +. No post nasal drip        Assessment:       ICD-10-CM   1. Moderate persistent asthma without complication  J45.40          Plan:     Patient Instructions     ICD-10-CM   1. Moderate persistent asthma  without complication  J45.40      Stable disease Glad you are uptodate with respiratory vaccines and maintaining a healthy life style  PLAN Continue pulmicort  1 puff Twice daily.  - Brush tongue and rinse mouth afterwards - We diddid 90 day supply with 1 year refill Continue Albuterol  inhaler 2 puffs every 6 hours as needed for shortness of breath or wheezing.  Continue flonase  2 sprays each nostril daily    Follow up in 6 months with Dr. Geronimo. -  If symptoms do not improve or worsen, please contact office for sooner follow up or seek emergency care   FOLLOWUP Return in about 6 months (around 01/24/2024) for 15 min visit, Face to Face OR Video Visit, with Dr Geronimo.    SIGNATURE    Dr. Dorethia Geronimo, M.D., F.C.C.P,  Pulmonary and Critical Care Medicine Staff Physician, [facility] Center Director - Interstitial Lung Disease  Program  Pulmonary Fibrosis [facility] at [facility] [place], [place], [address]  Pager: [phone], If no answer or between  15:00h - 7:00h: call [phone] Telephone: [phone]  4:31 PM 07/27/2023

## 2023-07-27 NOTE — Patient Instructions (Addendum)
 ICD-10-CM   1. Moderate persistent asthma without complication  J45.40      Stable disease Glad you are uptodate with respiratory vaccines and maintaining a healthy life style  PLAN Continue pulmicort  1 puff Twice daily.  - Brush tongue and rinse mouth afterwards - We diddid 90 day supply with 1 year refill Continue Albuterol  inhaler 2 puffs every 6 hours as needed for shortness of breath or wheezing.  Continue flonase  2 sprays each nostril daily    Follow up in 6 months with Dr. Geronimo. -  If symptoms do not improve or worsen, please contact office for sooner follow up or seek emergency care

## 2023-07-29 ENCOUNTER — Ambulatory Visit: Payer: Medicare Other

## 2023-07-29 ENCOUNTER — Other Ambulatory Visit: Payer: Self-pay | Admitting: Endocrinology

## 2023-07-29 VITALS — BP 139/74 | HR 102 | Temp 97.5°F | Resp 18 | Ht 60.0 in | Wt 134.0 lb

## 2023-07-29 DIAGNOSIS — M810 Age-related osteoporosis without current pathological fracture: Secondary | ICD-10-CM

## 2023-07-29 MED ORDER — DENOSUMAB 60 MG/ML ~~LOC~~ SOSY
60.0000 mg | PREFILLED_SYRINGE | Freq: Once | SUBCUTANEOUS | Status: AC
  Administered 2023-07-29: 60 mg via SUBCUTANEOUS
  Filled 2023-07-29: qty 1

## 2023-07-29 NOTE — Progress Notes (Signed)
 Diagnosis: Osteoporosis  Provider:  Mannam, Praveen MD  Procedure: Injection  Prolia  (Denosumab ), Dose: 60 mg, Site: subcutaneous, Number of injections: 1  Injection Site(s): Right arm  Post Care:     Discharge: Condition: Good, Destination: Home . AVS Declined  Performed by:  Cashius Grandstaff, RN

## 2023-07-30 ENCOUNTER — Telehealth: Payer: Self-pay | Admitting: Pharmacy Technician

## 2023-07-30 NOTE — Telephone Encounter (Addendum)
Patient will be scheduled as soon as possible Auth Submission: APPROVED Site of care: Site of care: [facility] Payer: [employer] & medicare a/b Medication & CPT/J Code(s) submitted: Prolia (Denosumab) [id number] Route of submission (phone, fax, portal):  Phone # Fax # Auth type: Buy/Bill Units/visits requested: 1 Reference number: [id number]  Approval from: 01/09/23 to 01/08/24

## 2023-09-07 NOTE — Progress Notes (Unsigned)
 No chief complaint on file.     ASSESSMENT AND PLAN  Jasmine Velez is a 83 y.o. 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     MEDICAL HISTORY:  Update 09/08/2023 JM: Patient returns for 85-month follow-up visit.  Overall has been doing well without any reoccurring vision loss or new stroke/TIA symptoms.  Remains on aspirin 81 mg daily *** and Crestor 10mg  daily.  Stroke risk factors monitored by PCP.       Consult visit 02/25/2023 Jasmine Velez: Jasmine Velez, is a 83 year old female, seen in request by her primary care from [facility] Dr. Chanetta Marshall, Meridee Score, for evaluation of loss of vision of right eye, initial evaluation was on February 25, 2023  I reviewed and summarized the referring note. PMHX. HLD HTN Left hip replacement Cataract surgery.  On February 09, 2023, she had a sudden onset right visual change, described a curtain dropping down from her right upper eye field, she looked Jasmine Velez at the mirror, then noticed gradual receding of the right upper visual field  deficit, episode lasted about 5 minutes, there is no other focal signs  She was seen by her ophthalmologist Dr. Dione Booze on February 10, 2023, concerning for amaurosis fugax, suggested workup, already had MRI of the brain on February 11, 2023, mild small vessel disease no acute abnormality  Ultrasound of carotid artery February 17, 2023, moderate heterogeneous and calcified plaque, with no hemodynamic significant stenosis.  Laboratory evaluation from [facility] on February 11, 2023, normal CBC hemoglobin of 12.2, LDL was reported to 146, normal TSH, free T4, ESR was slightly elevated 26    PHYSICAL EXAM:   There were no vitals filed for this visit.  There is no height or weight on file to calculate BMI.  PHYSICAL EXAMNIATION:  Gen: NAD, conversant, well nourised, well groomed                     Cardiovascular: Regular rate rhythm, no peripheral edema, warm, nontender. Eyes: Conjunctivae clear without exudates or hemorrhage Neck: Supple, no carotid bruits. Pulmonary: Clear to auscultation bilaterally   NEUROLOGICAL EXAM:  MENTAL STATUS: Speech/cognition: Awake, alert, oriented to history taking and casual conversation CRANIAL NERVES: CN II: Visual fields are full to confrontation. Pupils are round equal and briskly reactive to light. CN III, IV, VI: extraocular movement are normal. No ptosis. CN V: Facial sensation is intact to light touch CN VII: Face is symmetric with normal eye closure  CN VIII: Hearing is normal to causal conversation. CN IX, X: Phonation is normal. CN XI: Head turning and shoulder shrug are intact  MOTOR: There is no pronator drift of out-stretched  arms. Muscle bulk and tone are normal. Muscle strength is normal.  REFLEXES: Reflexes are 2+ and symmetric at the biceps, triceps, knees, and ankles. Plantar responses are bilaterally  SENSORY: Intact to light touch, pinprick and vibratory sensation are intact in fingers and toes.  COORDINATION: There is no trunk or limb  dysmetria noted.  GAIT/STANCE: Need push-up to get up from seated position, stiff, cautious  REVIEW OF SYSTEMS:  Full 14 system review of systems performed and notable only for as above All other review of systems were negative.   ALLERGIES: Allergies  Allergen Reactions   Other Other (See Comments)    ALL peppers causes severe gastric distress per patient.  Paprika.    Tramadol Nausea And Vomiting   Alendronate Sodium     Other Reaction(s): stomach upset   Ciprofloxacin Other (See Comments)    Other Reaction(s): mouth/throat rash, GI issues   Fosamax [Alendronate] Nausea Only    diarrhea   Levothyroxine Other (See Comments)    Other Reaction(s): GI issues   Nirmatrelvir-Ritonavir Diarrhea, Itching and Nausea Only   Amlodipine Nausea Only   Ceclor [Cefaclor] Nausea Only    Gerd   Erythromycin Rash    In mouth   Nexium [Esomeprazole Magnesium] Nausea Only    sick   Penicillins Rash    In her mouth   Statins Other (See Comments)    Muscle pain, nausea, reflux   Sulfa Antibiotics Rash    HOME MEDICATIONS: Current Outpatient Medications  Medication Sig Dispense Refill   Apoaequorin (PREVAGEN) 10 MG CAPS Take 1 tablet by mouth daily after breakfast.     budesonide (PULMICORT) 180 MCG/ACT inhaler Inhale 1 puff into the lungs 2 (two) times daily. 3 each 1   CALCIUM PO Take 2 tablets by mouth daily.     cholecalciferol (VITAMIN D) 25 MCG (1000 UNIT) tablet 2 tablets     denosumab (PROLIA) 60 MG/ML SOSY injection Inject 60 mg into the skin every 6 (six) months.     fluticasone (FLONASE) 50 MCG/ACT nasal spray SPRAY 2 SPRAYS INTO EACH NOSTRIL EVERY DAY 16 mL 3   hydrochlorothiazide (HYDRODIURIL) 25 MG tablet Take 25 mg by mouth daily.     hydrocortisone 2.5 % cream 1 application     Multiple Vitamin (MULTIVITAMIN WITH MINERALS) TABS tablet Take 1 tablet by mouth daily.     RABEprazole (ACIPHEX) 20 MG tablet Take 1 tablet (20 mg total) by mouth daily. 90 tablet 3    rosuvastatin (CRESTOR) 10 MG tablet Take 10 mg by mouth daily.     thyroid (ARMOUR THYROID) 15 MG tablet Take 1 tablet (15 mg total) by mouth daily. 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    Asthma    Cancer (HCC)    colon cancer    Cataract    removed both eyes    GERD (gastroesophageal reflux disease)    Hyperlipidemia    PUD (peptic ulcer disease)    25 yrs ago    Rosacea    Sciatica    UTI (lower urinary tract infection)     PAST SURGICAL HISTORY:  Past Surgical History:  Procedure Laterality Date   CATARACT EXTRACTION  2013, 2014   COLON RESECTION  2008   COLONOSCOPY     LASIK  2000   NASAL SEPTUM SURGERY  1980's   PILONIDAL CYST EXCISION  1960   TONSILLECTOMY  1960   TOTAL HIP ARTHROPLASTY  1994    FAMILY HISTORY: Family History  Problem Relation Age of Onset   CAD Father    Dementia Mother    Dementia Sister    Colon polyps Neg Hx    Colon cancer Neg Hx    Rectal cancer Neg Hx    Stomach cancer Neg Hx    Thyroid disease Neg Hx    Esophageal cancer Neg Hx    Liver disease Neg Hx    Pancreatic cancer Neg Hx     SOCIAL HISTORY: Social History   Socioeconomic History   Marital status: Significant Other    Spouse name: Not on file   Number of children: 1   Years of education: Not on file   Highest education level: Not on file  Occupational History   Occupation: retired  Tobacco Use   Smoking status: Never   Smokeless tobacco: Never  Vaping Use   Vaping status: Never Used  Substance and Sexual Activity   Alcohol use: Not Currently   Drug use: No   Sexual activity: Not Currently  Other Topics Concern   Not  on file  Social History Narrative   Has a boyfriend, "sexually active" at 49 years old   Social Drivers of Corporate investment banker Strain: Not on file  Food Insecurity: Not on file  Transportation Needs: Not on file  Physical Activity: Not on file  Stress: Not on file  Social Connections: Not on file  Intimate Partner Violence: Not on file      I spent *** minutes of face-to-face and non-face-to-face time with patient.  This included previsit chart review, lab review, study review, order entry, electronic health record documentation, patient education and discussion regarding above diagnoses and treatment plan and answered all other questions to patient's satisfaction  Ihor Austin, [facility]  [address] [facility] [address] [place], [place] [address]  Phone [phone] Fax [phone] Note: This document was prepared with digital dictation and possible smart phrase technology. Any transcriptional errors that result from this process are unintentional.

## 2023-09-08 ENCOUNTER — Ambulatory Visit (INDEPENDENT_AMBULATORY_CARE_PROVIDER_SITE_OTHER): Payer: Medicare Other | Admitting: Adult Health

## 2023-09-08 ENCOUNTER — Encounter: Payer: Self-pay | Admitting: Adult Health

## 2023-09-08 VITALS — BP 143/59 | HR 66 | Ht 60.0 in | Wt 134.0 lb

## 2023-09-08 DIAGNOSIS — G453 Amaurosis fugax: Secondary | ICD-10-CM | POA: Diagnosis not present

## 2023-09-08 NOTE — Patient Instructions (Addendum)
Continue aspirin 81 mg daily  and Crestor for secondary stroke prevention   Continue to follow up with PCP regarding cholesterol and blood pressure management  Maintain strict control of hypertension with blood pressure goal below 130/90 and cholesterol with LDL cholesterol (bad cholesterol) goal below 70 mg/dL.   Signs of a Stroke? Follow the BEFAST method:  Balance Watch for a sudden loss of balance, trouble with coordination or vertigo Eyes Is there a sudden loss of vision in one or both eyes? Or double vision?  Face: Ask the person to smile. Does one side of the face droop or is it numb?  Arms: Ask the person to raise both arms. Does one arm drift downward? Is there weakness or numbness of a leg? Speech: Ask the person to repeat a simple phrase. Does the speech sound slurred/strange? Is the person confused ? Time: If you observe any of these signs, call 911.       Thank you for coming to see us at [facility]. I hope we have been able to provide you high quality care today.  You may receive a patient satisfaction survey over the next few weeks. We would appreciate your feedback and comments so that we may continue to improve ourselves and the health of our patients.  

## 2023-09-29 ENCOUNTER — Other Ambulatory Visit: Payer: Self-pay | Admitting: Internal Medicine

## 2023-10-06 DIAGNOSIS — D485 Neoplasm of uncertain behavior of skin: Secondary | ICD-10-CM | POA: Diagnosis not present

## 2023-10-17 DIAGNOSIS — Z23 Encounter for immunization: Secondary | ICD-10-CM | POA: Diagnosis not present

## 2023-12-12 ENCOUNTER — Telehealth: Payer: Self-pay

## 2023-12-12 NOTE — Telephone Encounter (Signed)
 Patient will be scheduled as soon as possible Auth Submission: APPROVED Site of care: Site of care: [facility] Payer: [employer] & medicare a/b Medication & CPT/J Code(s) submitted: Prolia  (Denosumab ) [id number] Route of submission (phone, fax, portal):  Phone # Fax # Auth type: Buy/Bill Units/visits requested: 1 Reference number: [id number]  Approval from: 01/09/23 to 08/13/24

## 2023-12-14 DIAGNOSIS — I10 Essential (primary) hypertension: Secondary | ICD-10-CM | POA: Diagnosis not present

## 2023-12-14 DIAGNOSIS — M9903 Segmental and somatic dysfunction of lumbar region: Secondary | ICD-10-CM | POA: Diagnosis not present

## 2023-12-14 DIAGNOSIS — E7800 Pure hypercholesterolemia, unspecified: Secondary | ICD-10-CM | POA: Diagnosis not present

## 2023-12-14 DIAGNOSIS — M9905 Segmental and somatic dysfunction of pelvic region: Secondary | ICD-10-CM | POA: Diagnosis not present

## 2023-12-14 DIAGNOSIS — M5136 Other intervertebral disc degeneration, lumbar region with discogenic back pain only: Secondary | ICD-10-CM | POA: Diagnosis not present

## 2023-12-14 DIAGNOSIS — M9904 Segmental and somatic dysfunction of sacral region: Secondary | ICD-10-CM | POA: Diagnosis not present

## 2023-12-15 DIAGNOSIS — M9903 Segmental and somatic dysfunction of lumbar region: Secondary | ICD-10-CM | POA: Diagnosis not present

## 2023-12-15 DIAGNOSIS — E7800 Pure hypercholesterolemia, unspecified: Secondary | ICD-10-CM | POA: Diagnosis not present

## 2023-12-15 DIAGNOSIS — M9904 Segmental and somatic dysfunction of sacral region: Secondary | ICD-10-CM | POA: Diagnosis not present

## 2023-12-15 DIAGNOSIS — M9905 Segmental and somatic dysfunction of pelvic region: Secondary | ICD-10-CM | POA: Diagnosis not present

## 2023-12-15 DIAGNOSIS — J4540 Moderate persistent asthma, uncomplicated: Secondary | ICD-10-CM | POA: Diagnosis not present

## 2023-12-15 DIAGNOSIS — I10 Essential (primary) hypertension: Secondary | ICD-10-CM | POA: Diagnosis not present

## 2023-12-15 DIAGNOSIS — M810 Age-related osteoporosis without current pathological fracture: Secondary | ICD-10-CM | POA: Diagnosis not present

## 2023-12-15 DIAGNOSIS — Z0000 Encounter for general adult medical examination without abnormal findings: Secondary | ICD-10-CM | POA: Diagnosis not present

## 2023-12-15 DIAGNOSIS — M5136 Other intervertebral disc degeneration, lumbar region with discogenic back pain only: Secondary | ICD-10-CM | POA: Diagnosis not present

## 2023-12-15 DIAGNOSIS — K219 Gastro-esophageal reflux disease without esophagitis: Secondary | ICD-10-CM | POA: Diagnosis not present

## 2023-12-15 DIAGNOSIS — I499 Cardiac arrhythmia, unspecified: Secondary | ICD-10-CM | POA: Diagnosis not present

## 2023-12-16 DIAGNOSIS — M5136 Other intervertebral disc degeneration, lumbar region with discogenic back pain only: Secondary | ICD-10-CM | POA: Diagnosis not present

## 2023-12-16 DIAGNOSIS — M9905 Segmental and somatic dysfunction of pelvic region: Secondary | ICD-10-CM | POA: Diagnosis not present

## 2023-12-16 DIAGNOSIS — M9903 Segmental and somatic dysfunction of lumbar region: Secondary | ICD-10-CM | POA: Diagnosis not present

## 2023-12-16 DIAGNOSIS — M9904 Segmental and somatic dysfunction of sacral region: Secondary | ICD-10-CM | POA: Diagnosis not present

## 2023-12-21 DIAGNOSIS — D485 Neoplasm of uncertain behavior of skin: Secondary | ICD-10-CM | POA: Diagnosis not present

## 2023-12-21 DIAGNOSIS — L570 Actinic keratosis: Secondary | ICD-10-CM | POA: Diagnosis not present

## 2023-12-21 DIAGNOSIS — L821 Other seborrheic keratosis: Secondary | ICD-10-CM | POA: Diagnosis not present

## 2023-12-24 ENCOUNTER — Other Ambulatory Visit: Payer: Medicare Other

## 2023-12-25 ENCOUNTER — Other Ambulatory Visit

## 2023-12-25 DIAGNOSIS — E063 Autoimmune thyroiditis: Secondary | ICD-10-CM | POA: Diagnosis not present

## 2023-12-26 LAB — T3, FREE: T3, Free: 3.6 pg/mL (ref 2.3–4.2)

## 2023-12-26 LAB — T4, FREE: Free T4: 1.1 ng/dL (ref 0.8–1.8)

## 2023-12-26 LAB — TSH: TSH: 3.67 m[IU]/L (ref 0.40–4.50)

## 2023-12-28 ENCOUNTER — Ambulatory Visit: Payer: Self-pay | Admitting: Endocrinology

## 2023-12-29 ENCOUNTER — Ambulatory Visit (INDEPENDENT_AMBULATORY_CARE_PROVIDER_SITE_OTHER): Payer: Medicare Other | Admitting: Endocrinology

## 2023-12-29 ENCOUNTER — Encounter: Payer: Self-pay | Admitting: Endocrinology

## 2023-12-29 VITALS — BP 130/70 | HR 67 | Resp 16 | Ht 60.0 in | Wt 135.0 lb

## 2023-12-29 DIAGNOSIS — E063 Autoimmune thyroiditis: Secondary | ICD-10-CM

## 2023-12-29 NOTE — Progress Notes (Signed)
 Outpatient Endocrinology Note Jasmine Fani Rotondo, MD  12/29/23  Patient's Name: Jasmine Velez    DOB: 22-Aug-1940    MRN: [id number]  REASON OF VISIT: Follow-up for hypothyroidism  PCP: Ransom Byers, MD  HISTORY OF PRESENT ILLNESS:   Jasmine Velez is a 83 y.o. old female with past medical history as listed below is presented for a follow up for hypothyroidism.   Pertinent Thyroid  History: Patient was previously seen by Dr. Hubert Madden and was last time seen in June 2024.  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  At the time of diagnosis TSH was 6.7.  She did not have symptoms of fatigue, cold intolerance or weight change.  She was treated with levothyroxine or Armour Thyroid  in the past.  Initially started on levothyroxine, she thinks it caused her to have more reflux symptoms and she was switched to Armour Thyroid  15 mg daily.  Initial consultation with endocrinology in December 2021.  At initial consultation in December 2021 with endocrinology she was taking Armour Thyroid  30 mg daily, she had subclinical hypothyroidism and asked to stop Armour Thyroid .  In follow-up visit in May 2022 she had symptoms of fatigue, cold intolerance and forgetfulness discharge at that time was 5.8 and she was restarted on Armour Thyroid  15 mg daily.  She felt better after being on Armour Thyroid .  # Osteoporosis : On Prolia  every 55-month.  Managed by primary care provider.  Interval history Patient has been taking Armour Thyroid  15 mg daily.  She complains of cold intolerance, feeling cold.  Denies change in bowel habit.  Overall feeling normal energy.  No palpitation or heat intolerance.  Patient thyroid  function test normal as follows including free T3.  No other complaints today.   Latest Reference Range & Units 12/25/23 11:18  TSH 0.40 - 4.50 mIU/L 3.67  Triiodothyronine,Free,Serum 2.3 - 4.2 pg/mL 3.6  T4,Free(Direct) 0.8 - 1.8 ng/dL 1.1    REVIEW OF SYSTEMS:  As per history of  present illness.   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    Asthma    Cancer (HCC)    colon cancer    Cataract    removed both eyes    GERD (gastroesophageal reflux disease)    Hyperlipidemia    PUD (peptic ulcer disease)    25 yrs ago    Rosacea    Sciatica    UTI (lower urinary tract infection)     PAST SURGICAL HISTORY: Past Surgical History:  Procedure Laterality Date   CATARACT EXTRACTION  2013, 2014   COLON RESECTION  2008   COLONOSCOPY     LASIK  2000   NASAL SEPTUM SURGERY  1980's   PILONIDAL CYST EXCISION  1960   TONSILLECTOMY  1960   TOTAL HIP ARTHROPLASTY  1994    ALLERGIES: Allergies  Allergen Reactions   Other Other (See Comments)    ALL peppers causes severe gastric distress per patient.  Paprika.    Tramadol Nausea And Vomiting   Alendronate Sodium     Other Reaction(s): stomach upset   Ciprofloxacin Other (See Comments)    Other Reaction(s): mouth/throat rash, GI issues   Fosamax [Alendronate] Nausea Only    diarrhea   Levothyroxine Other (See Comments)    Other Reaction(s): GI issues   Nirmatrelvir-Ritonavir Diarrhea, Itching and Nausea Only   Amlodipine  Nausea Only   Ceclor [Cefaclor] Nausea Only    Gerd   Erythromycin Rash    In mouth   Nexium [Esomeprazole Magnesium] Nausea Only    sick   Penicillins Rash    In her mouth   Statins Other (See Comments)    Muscle pain, nausea, reflux   Sulfa Antibiotics Rash    FAMILY HISTORY:  Family History  Problem Relation Age of Onset   CAD Father    Dementia Mother    Dementia Sister    Colon polyps Neg Hx    Colon cancer Neg Hx    Rectal cancer Neg Hx    Stomach cancer Neg Hx    Thyroid  disease Neg Hx    Esophageal cancer Neg Hx    Liver disease Neg Hx    Pancreatic cancer Neg Hx     SOCIAL HISTORY: Social History   Socioeconomic History   Marital status: Significant  Other    Spouse name: Not on file   Number of children: 1   Years of education: Not on file   Highest education level: Not on file  Occupational History   Occupation: retired  Tobacco Use   Smoking status: Never   Smokeless tobacco: Never  Vaping Use   Vaping status: Never Used  Substance and Sexual Activity   Alcohol use: Not Currently   Drug use: No   Sexual activity: Not Currently  Other Topics Concern   Not on file  Social History Narrative   Has a boyfriend, sexually active at 73 years old   Social Drivers of Corporate investment banker Strain: Not on file  Food Insecurity: Not on file  Transportation Needs: Not on file  Physical Activity: Not on file  Stress: Not on file  Social Connections: Not on file    MEDICATIONS:  Current Outpatient Medications  Medication Sig Dispense Refill   aspirin EC 81 MG tablet Take 81 mg by mouth daily. Swallow whole.     budesonide  (PULMICORT ) 180 MCG/ACT inhaler Inhale 1 puff into the lungs 2 (two) times daily. 3 each 1   CALCIUM PO Take 2 tablets by mouth daily.     cholecalciferol (VITAMIN D ) 25 MCG (1000 UNIT) tablet 2 tablets     denosumab  (PROLIA ) 60 MG/ML SOSY injection Inject 60 mg into the skin every 6 (six) months.     fluticasone  (FLONASE ) 50 MCG/ACT nasal spray SPRAY 2 SPRAYS INTO EACH NOSTRIL EVERY DAY 48 mL 1   hydrochlorothiazide (HYDRODIURIL) 25 MG tablet Take 25 mg by mouth daily.     Misc Natural Products (NEURIVA PO) Take by mouth.     Multiple Vitamin (MULTIVITAMIN WITH MINERALS) TABS tablet Take 1 tablet by mouth daily.     RABEprazole  (ACIPHEX ) 20 MG tablet Take 1 tablet (20 mg total) by mouth daily. 90 tablet 3   rosuvastatin (CRESTOR) 10 MG tablet Take 10 mg by mouth daily.     thyroid  (ARMOUR THYROID ) 15 MG tablet Take 1 tablet (15 mg total) by mouth daily. 90 tablet 3   Apoaequorin (PREVAGEN) 10 MG CAPS Take 1 tablet by mouth daily after breakfast.     hydrocortisone 2.5 % cream 1 application      Current Facility-Administered Medications  Medication Dose Route Frequency Provider Last Rate Last Admin   methylPREDNISolone  acetate (DEPO-MEDROL ) injection 80 mg  80 mg Intramuscular Once Cobb, Mariah Shines, NP        PHYSICAL EXAM: Vitals:   12/29/23 1026  BP:  130/70  Pulse: 67  Resp: 16  SpO2: 99%  Weight: 135 lb (61.2 kg)  Height: 5' (1.524 m)    Body mass index is 26.37 kg/m.  Wt Readings from Last 3 Encounters:  12/29/23 135 lb (61.2 kg)  09/08/23 134 lb (60.8 kg)  07/29/23 134 lb (60.8 kg)    General: Well developed, well nourished female in no apparent distress.  HEENT: AT/[place], no external lesions. Hearing intact to the spoken word Eyes: Conjunctiva clear and no icterus. Neck: Trachea midline, neck supple Abdomen: Soft, non tender, non distended Neurologic: Alert, oriented, normal speech, deep tendon biceps reflexes normal Extremities: No pedal pitting edema, no tremors of outstretched hands Skin: Warm, color good.  Psychiatric: Does not appear depressed or anxious  PERTINENT HISTORIC LABORATORY AND IMAGING STUDIES:  All pertinent laboratory results were reviewed. Please see HPI also for further details.   TSH  Date Value Ref Range Status  12/25/2023 3.67 0.40 - 4.50 mIU/L Final  06/23/2023 4.14 0.40 - 4.50 mIU/L Final  12/24/2022 3.57 0.35 - 5.50 uIU/mL Final     ASSESSMENT / PLAN  1. Acquired autoimmune hypothyroidism    -Patient has hypothyroidism diagnosed in 2020.  Patient had history of GI intolerance with levothyroxine.  She has been on Armour Thyroid . -Discussed that Armour Thyroid  can potentially cause increased bone loss compared to levothyroxine.  However due to GI intolerance with levothyroxine in the past we will continue on Armour Thyroid  at this time.  As long as we keep thyroid  function test in the normal range should be okay.  Plan: -Continue current dose of Armour Thyroid  15 mg daily.  Recent thyroid  function test  normal. -Endocrinology follow-up in 6 months, with thyroid  lab TSH, free T4.    Diagnoses and all orders for this visit:  Acquired autoimmune hypothyroidism -     T4, free -     TSH   DISPOSITION Follow up in clinic in 6 months suggested.  Labs prior to follow-up visit.  All questions answered and patient verbalized understanding of the plan.  Jasmine Dravon Nott, MD [facility] [address] [place], [address] [place], [place] [address] Phone # [phone]  At least part of this note was generated using voice recognition software. Inadvertent word errors may have occurred, which were not recognized during the proofreading process.

## 2024-01-05 ENCOUNTER — Other Ambulatory Visit: Payer: Self-pay | Admitting: Family Medicine

## 2024-01-05 DIAGNOSIS — Z1231 Encounter for screening mammogram for malignant neoplasm of breast: Secondary | ICD-10-CM

## 2024-01-07 DIAGNOSIS — I499 Cardiac arrhythmia, unspecified: Secondary | ICD-10-CM | POA: Diagnosis not present

## 2024-01-13 DIAGNOSIS — I499 Cardiac arrhythmia, unspecified: Secondary | ICD-10-CM | POA: Diagnosis not present

## 2024-01-18 ENCOUNTER — Encounter (INDEPENDENT_AMBULATORY_CARE_PROVIDER_SITE_OTHER): Payer: Medicare Other | Admitting: Ophthalmology

## 2024-01-18 DIAGNOSIS — H43813 Vitreous degeneration, bilateral: Secondary | ICD-10-CM

## 2024-01-18 DIAGNOSIS — H353132 Nonexudative age-related macular degeneration, bilateral, intermediate dry stage: Secondary | ICD-10-CM | POA: Diagnosis not present

## 2024-01-26 ENCOUNTER — Ambulatory Visit: Payer: Medicare Other

## 2024-01-26 VITALS — BP 135/67 | HR 70 | Temp 97.9°F | Resp 12 | Ht 60.0 in | Wt 133.0 lb

## 2024-01-26 DIAGNOSIS — M810 Age-related osteoporosis without current pathological fracture: Secondary | ICD-10-CM | POA: Diagnosis not present

## 2024-01-26 MED ORDER — DENOSUMAB 60 MG/ML ~~LOC~~ SOSY
60.0000 mg | PREFILLED_SYRINGE | Freq: Once | SUBCUTANEOUS | Status: AC
  Administered 2024-01-26: 60 mg via SUBCUTANEOUS
  Filled 2024-01-26: qty 1

## 2024-01-26 NOTE — Progress Notes (Signed)
 Diagnosis: Osteoporosis  Provider:  Mannam, Praveen MD  Procedure: Injection  Prolia  (Denosumab ), Dose: 60 mg, Site: subcutaneous, Number of injections: 1  Injection Site(s): Left arm  Post Care: Patient declined observation  Discharge: Condition: Stable, Destination: Home . AVS Provided  Performed by:  Lendel Quant, RN

## 2024-01-29 ENCOUNTER — Encounter: Payer: Self-pay | Admitting: Advanced Practice Midwife

## 2024-02-12 ENCOUNTER — Ambulatory Visit
Admission: RE | Admit: 2024-02-12 | Discharge: 2024-02-12 | Disposition: A | Discharge status: Home / Self Care | Source: Ambulatory Visit | Attending: Family Medicine | Admitting: Family Medicine

## 2024-02-12 DIAGNOSIS — Z1231 Encounter for screening mammogram for malignant neoplasm of breast: Secondary | ICD-10-CM

## 2024-02-19 DIAGNOSIS — M47816 Spondylosis without myelopathy or radiculopathy, lumbar region: Secondary | ICD-10-CM | POA: Diagnosis not present

## 2024-02-24 DIAGNOSIS — I10 Essential (primary) hypertension: Secondary | ICD-10-CM | POA: Diagnosis not present

## 2024-02-24 DIAGNOSIS — R55 Syncope and collapse: Secondary | ICD-10-CM | POA: Diagnosis not present

## 2024-03-04 DIAGNOSIS — R531 Weakness: Secondary | ICD-10-CM | POA: Diagnosis not present

## 2024-03-04 DIAGNOSIS — I10 Essential (primary) hypertension: Secondary | ICD-10-CM | POA: Diagnosis not present

## 2024-03-04 DIAGNOSIS — R42 Dizziness and giddiness: Secondary | ICD-10-CM | POA: Diagnosis not present

## 2024-03-17 DIAGNOSIS — I10 Essential (primary) hypertension: Secondary | ICD-10-CM | POA: Diagnosis not present

## 2024-03-17 DIAGNOSIS — M549 Dorsalgia, unspecified: Secondary | ICD-10-CM | POA: Diagnosis not present

## 2024-03-17 DIAGNOSIS — E871 Hypo-osmolality and hyponatremia: Secondary | ICD-10-CM | POA: Diagnosis not present

## 2024-03-17 DIAGNOSIS — R195 Other fecal abnormalities: Secondary | ICD-10-CM | POA: Diagnosis not present

## 2024-04-12 DIAGNOSIS — Z23 Encounter for immunization: Secondary | ICD-10-CM | POA: Diagnosis not present

## 2024-04-21 ENCOUNTER — Telehealth: Payer: Self-pay

## 2024-04-21 DIAGNOSIS — J4540 Moderate persistent asthma, uncomplicated: Secondary | ICD-10-CM

## 2024-04-21 NOTE — Telephone Encounter (Signed)
 Copied from CRM [id number]. Topic: Clinical - Prescription Issue >> Apr 18, 2024  3:08 PM Joesph PARAS wrote: Reason for CRM: Patient is calling to state that she has an issue with budesonide  (PULMICORT ) 180 MCG/ACT inhaler. She is requesting that nurse of Dr. Geronimo call her to discuss. Declined to provide details to PAS.   Pt states she is having issues with her insurance. Pt states the last time she was here, she saw Dr Geronimo, and he told her that he would have his nurse help him with a Pulmicort  RX. Pt's insurance told her that she should get 3 inhalers at a time since full price is $150 with 1 inhaler and if she gets 3 inhalers, she can pay $75.   Pt states she has 3 refills but she has to get 1 inhaler at a time and pt wants to pick up 3 inhalers at a time for a cheaper copay. Pt states that if Dr Geronimo is unable to do this, then she will switch the RX back to her PCP.   Pt is okay with leaving a detailed message if she does not answer he phone

## 2024-04-22 ENCOUNTER — Encounter: Payer: Self-pay | Admitting: Endocrinology

## 2024-04-22 ENCOUNTER — Other Ambulatory Visit (HOSPITAL_COMMUNITY): Payer: Self-pay

## 2024-04-22 MED ORDER — BUDESONIDE 90 MCG/ACT IN AEPB: 2.0000 | INHALATION_SPRAY | Freq: Two times a day (BID) | RESPIRATORY_TRACT | 1 refills | Status: AC

## 2024-04-22 MED ORDER — BUDESONIDE 90 MCG/ACT IN AEPB: 2.0000 | INHALATION_SPRAY | Freq: Two times a day (BID) | RESPIRATORY_TRACT | 1 refills | Status: DC

## 2024-04-22 NOTE — Telephone Encounter (Signed)
 Typically 90-day supplies are cheaper.  Typically 90-day supplies are through mail order pharmacy.  Please help the patient and do 90-day supply.  I am fine with this.

## 2024-04-22 NOTE — Telephone Encounter (Signed)
 Called [facility]. Pharmacy unable to dispense 90 day supply because Pulmicort  inhaler has 120 actuations per inhaler, and SIG for 1 puff twice daily means one inhaler is a 60 day supply.   To dispense 90 days, need updated SIG for 2 puffs twice daily if appropriate.   Routing to Dr. Geronimo before entering new Rx given this would require change in SIG.

## 2024-04-22 NOTE — Telephone Encounter (Signed)
 Patient has been getting Pulmicort  153mcg/act inhaler, 1 puff (180mcg dose total) twice daily.   For affordability, entered prescription for Pulmicort  45mcg/act inhaler, 2 puffs (180mcg dose total) twice daily. Test claim for 90 day supply $55.   Called patient to inform her that this is the same dose she has been getting - in order to get the same dose as she is currently taking, she will need to take 2 puffs twice daily in order to get the same dose.   Patient requests email with information above.  Patient verbalizes understanding.    Electronic Rx was not sent - unclear why. Called in verbal order to [facility] on [address]:   Pulmicort  66mcg/act inhaler Inhale 2 puffs (180mcg total) BID  Disp qty for 90 day supply, 1 refill Prescriber: Geronimo Dorethia Aleck [place], PharmD, BCPS, CPP Clinical Pharmacist  [facility]

## 2024-04-22 NOTE — Telephone Encounter (Signed)
 Aleck  ARe you able to drop the dose on the pulmicor to 90mcg and make it bid and then do 90 d supply? If so, please proceed  Thanks    SIGNATURE    Dr. Dorethia Cave, M.D., F.C.C.P,  Pulmonary and Critical Care Medicine Staff Physician, [facility] Director - Interstitial Lung Disease  Program  Pulmonary Fibrosis [facility] [employer], [place], [address]   Pager: [phone], If no answer  -> Check AMION or Try [phone] Telephone (clinical office): [phone] Telephone (research): [phone]  3:06 PM 04/22/2024

## 2024-04-25 NOTE — Telephone Encounter (Signed)
 Noted. Nothing further needed.

## 2024-05-17 DIAGNOSIS — R600 Localized edema: Secondary | ICD-10-CM | POA: Diagnosis not present

## 2024-05-17 DIAGNOSIS — I10 Essential (primary) hypertension: Secondary | ICD-10-CM | POA: Diagnosis not present

## 2024-05-17 DIAGNOSIS — R252 Cramp and spasm: Secondary | ICD-10-CM | POA: Diagnosis not present

## 2024-05-17 DIAGNOSIS — M79604 Pain in right leg: Secondary | ICD-10-CM | POA: Diagnosis not present

## 2024-05-26 ENCOUNTER — Other Ambulatory Visit: Payer: Self-pay | Admitting: Gastroenterology

## 2024-05-30 ENCOUNTER — Other Ambulatory Visit: Payer: Self-pay | Admitting: *Deleted

## 2024-05-30 MED ORDER — RABEPRAZOLE SODIUM 20 MG PO TBEC: 20.0000 mg | DELAYED_RELEASE_TABLET | Freq: Every day | ORAL | 3 refills | Status: AC

## 2024-05-31 DIAGNOSIS — R109 Unspecified abdominal pain: Secondary | ICD-10-CM | POA: Diagnosis not present

## 2024-05-31 DIAGNOSIS — R142 Eructation: Secondary | ICD-10-CM | POA: Diagnosis not present

## 2024-05-31 DIAGNOSIS — K5730 Diverticulosis of large intestine without perforation or abscess without bleeding: Secondary | ICD-10-CM | POA: Diagnosis not present

## 2024-05-31 DIAGNOSIS — R140 Abdominal distension (gaseous): Secondary | ICD-10-CM | POA: Diagnosis not present

## 2024-06-03 DIAGNOSIS — Z23 Encounter for immunization: Secondary | ICD-10-CM | POA: Diagnosis not present

## 2024-06-14 DIAGNOSIS — I10 Essential (primary) hypertension: Secondary | ICD-10-CM | POA: Diagnosis not present

## 2024-06-14 DIAGNOSIS — D649 Anemia, unspecified: Secondary | ICD-10-CM | POA: Diagnosis not present

## 2024-06-15 DIAGNOSIS — J4540 Moderate persistent asthma, uncomplicated: Secondary | ICD-10-CM | POA: Diagnosis not present

## 2024-06-15 DIAGNOSIS — D649 Anemia, unspecified: Secondary | ICD-10-CM | POA: Diagnosis not present

## 2024-06-15 DIAGNOSIS — I10 Essential (primary) hypertension: Secondary | ICD-10-CM | POA: Diagnosis not present

## 2024-06-17 ENCOUNTER — Other Ambulatory Visit: Payer: Self-pay

## 2024-06-20 ENCOUNTER — Other Ambulatory Visit (HOSPITAL_BASED_OUTPATIENT_CLINIC_OR_DEPARTMENT_OTHER): Payer: Self-pay

## 2024-06-20 MED ORDER — PULMICORT FLEXHALER 180 MCG/ACT IN AEPB
1.0000 | INHALATION_SPRAY | Freq: Two times a day (BID) | RESPIRATORY_TRACT | 3 refills | Status: AC
  Filled 2024-06-20: qty 1, 60d supply, fill #0
  Filled 2024-08-09: qty 1, 60d supply, fill #1

## 2024-06-21 ENCOUNTER — Other Ambulatory Visit (HOSPITAL_BASED_OUTPATIENT_CLINIC_OR_DEPARTMENT_OTHER): Payer: Self-pay

## 2024-06-24 ENCOUNTER — Other Ambulatory Visit

## 2024-06-24 DIAGNOSIS — E063 Autoimmune thyroiditis: Secondary | ICD-10-CM | POA: Diagnosis not present

## 2024-06-25 LAB — T4, FREE: Free T4: 1 ng/dL (ref 0.8–1.8)

## 2024-06-25 LAB — TSH: TSH: 2.89 m[IU]/L (ref 0.40–4.50)

## 2024-06-27 ENCOUNTER — Ambulatory Visit: Payer: Self-pay | Admitting: Endocrinology

## 2024-06-29 ENCOUNTER — Other Ambulatory Visit (HOSPITAL_BASED_OUTPATIENT_CLINIC_OR_DEPARTMENT_OTHER): Payer: Self-pay

## 2024-06-29 ENCOUNTER — Ambulatory Visit: Admitting: Endocrinology

## 2024-06-29 ENCOUNTER — Encounter: Payer: Self-pay | Admitting: Endocrinology

## 2024-06-29 VITALS — BP 138/72 | HR 86 | Ht 59.5 in | Wt 134.0 lb

## 2024-06-29 DIAGNOSIS — E063 Autoimmune thyroiditis: Secondary | ICD-10-CM

## 2024-06-29 MED ORDER — THYROID 15 MG PO TABS
15.0000 mg | ORAL_TABLET | Freq: Every day | ORAL | 3 refills | Status: AC
  Filled 2024-06-29 – 2024-07-21 (×2): qty 90, 90d supply, fill #0

## 2024-06-29 NOTE — Progress Notes (Signed)
 Outpatient Endocrinology Note Jasun Gasparini, MD  06/29/2024  Patient's Name: Jasmine Velez    DOB: February 03, 1941    MRN: [id number]  REASON OF VISIT: Follow-up for hypothyroidism  PCP: Chrystal Lamarr RAMAN, MD  HISTORY OF PRESENT ILLNESS:   Jasmine Velez is a 83 y.o. old female with past medical history as listed below is presented for a follow up for hypothyroidism.   Pertinent Thyroid  History: Patient was previously seen by Dr. Von and was last time seen in June 2024.  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  At the time of diagnosis TSH was 6.7.  She did not have symptoms of fatigue, cold intolerance or weight change.  She was treated with levothyroxine or Armour Thyroid  in the past.  Initially started on levothyroxine, she thinks it caused her to have more reflux symptoms and she was switched to Armour Thyroid  15 mg daily.  Initial consultation with endocrinology in December 2021.  At initial consultation in December 2021 with endocrinology she was taking Armour Thyroid  30 mg daily, she had subclinical hypothyroidism and asked to stop Armour Thyroid .  In follow-up visit in May 2022 she had symptoms of fatigue, cold intolerance and forgetfulness discharge at that time was 5.8 and she was restarted on Armour Thyroid  15 mg daily.  She felt better after being on Armour Thyroid .  # Osteoporosis : On Prolia  every 8-month.  Managed by primary care provider.  Interval history Patient has been taking Armour Thyroid  15 mg daily.  She denies palpitation and heat intolerance.  Overall feeling in usual state of health.  She had recent thyroid  function test normal as follows.  She has no other complaints today.   Latest Reference Range & Units 06/24/24 09:20  TSH 0.40 - 4.50 mIU/L 2.89  T4,Free(Direct) 0.8 - 1.8 ng/dL 1.0    REVIEW OF SYSTEMS:  As per history of present illness.   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    Asthma    Cancer (HCC)    colon cancer    Cataract    removed both eyes    GERD (gastroesophageal reflux disease)    Hyperlipidemia    PUD (peptic ulcer disease)    25 yrs ago    Rosacea    Sciatica    UTI (lower urinary tract infection)     PAST SURGICAL HISTORY: Past Surgical History:  Procedure Laterality Date   CATARACT EXTRACTION  2013, 2014   COLON RESECTION  2008   COLONOSCOPY     LASIK  2000   NASAL SEPTUM SURGERY  1980's   PILONIDAL CYST EXCISION  1960   TONSILLECTOMY  1960   TOTAL HIP ARTHROPLASTY  1994    ALLERGIES: Allergies  Allergen Reactions   Other Other (See Comments)    ALL peppers causes severe gastric distress per patient.  Paprika.    Tramadol Nausea And Vomiting   Alendronate Sodium     Other Reaction(s): stomach upset   Ciprofloxacin Other (See Comments)    Other Reaction(s): mouth/throat rash, GI issues   Fosamax [Alendronate] Nausea Only    diarrhea   Levothyroxine Other (See Comments)    Other Reaction(s): GI issues   Nirmatrelvir-Ritonavir Diarrhea, Itching and Nausea Only   Amlodipine Nausea Only   Ceclor [Cefaclor] Nausea Only    Gerd   Erythromycin Rash  In mouth   Nexium [Esomeprazole Magnesium] Nausea Only    sick   Penicillins Rash    In her mouth   Statins Other (See Comments)    Muscle pain, nausea, reflux   Sulfa Antibiotics Rash    FAMILY HISTORY:  Family History  Problem Relation Age of Onset   CAD Father    Dementia Mother    Dementia Sister    Colon polyps Neg Hx    Colon cancer Neg Hx    Rectal cancer Neg Hx    Stomach cancer Neg Hx    Thyroid  disease Neg Hx    Esophageal cancer Neg Hx    Liver disease Neg Hx    Pancreatic cancer Neg Hx     SOCIAL HISTORY: Social History   Socioeconomic History   Marital status: Significant Other    Spouse name: Not on file   Number of children: 1   Years of education: Not on file   Highest  education level: Not on file  Occupational History   Occupation: retired  Tobacco Use   Smoking status: Never   Smokeless tobacco: Never  Vaping Use   Vaping status: Never Used  Substance and Sexual Activity   Alcohol use: Not Currently   Drug use: No   Sexual activity: Not Currently  Other Topics Concern   Not on file  Social History Narrative   Has a boyfriend, sexually active at 14 years old   Social Drivers of Health   Tobacco Use: Low Risk (06/29/2024)   Patient History    Smoking Tobacco Use: Never    Smokeless Tobacco Use: Never    Passive Exposure: Not on file  Financial Resource Strain: Not on file  Food Insecurity: Not on file  Transportation Needs: Not on file  Physical Activity: Not on file  Stress: Not on file  Social Connections: Not on file  Depression (EYV7-0): Not on file  Alcohol Screen: Not on file  Housing: Not on file  Utilities: Not on file  Health Literacy: Not on file    MEDICATIONS:  Current Outpatient Medications  Medication Sig Dispense Refill   aspirin EC 81 MG tablet Take 81 mg by mouth daily. Swallow whole.     budesonide  (PULMICORT  FLEXHALER) 180 MCG/ACT inhaler Inhale 1 puff into the lungs 2 (two) times daily. 3 each 3   Budesonide  90 MCG/ACT inhaler Inhale 2 puffs into the lungs 2 (two) times daily. 3 each 1   CALCIUM PO Take 2 tablets by mouth daily.     cholecalciferol (VITAMIN D ) 25 MCG (1000 UNIT) tablet 2 tablets     denosumab  (PROLIA ) 60 MG/ML SOSY injection Inject 60 mg into the skin every 6 (six) months.     fluticasone  (FLONASE ) 50 MCG/ACT nasal spray SPRAY 2 SPRAYS INTO EACH NOSTRIL EVERY DAY 48 mL 1   hydrochlorothiazide (HYDRODIURIL) 25 MG tablet Take 25 mg by mouth daily.     Multiple Vitamin (MULTIVITAMIN WITH MINERALS) TABS tablet Take 1 tablet by mouth daily.     RABEprazole  (ACIPHEX ) 20 MG tablet Take 1 tablet (20 mg total) by mouth daily. 90 tablet 3   rosuvastatin (CRESTOR) 10 MG tablet Take 10 mg by mouth daily.      thyroid  (ARMOUR THYROID ) 15 MG tablet Take 1 tablet (15 mg total) by mouth daily. 90 tablet 3   Current Facility-Administered Medications  Medication Dose Route Frequency Provider Last Rate Last Admin   methylPREDNISolone  acetate (DEPO-MEDROL ) injection 80 mg  80 mg  Intramuscular Once Malachy Comer GAILS, NP        PHYSICAL EXAM: Vitals:   06/29/24 0903 06/29/24 0906  BP: (!) 142/86 138/72  Pulse:  86  SpO2: 96%   Weight: 134 lb (60.8 kg)   Height: 4' 11.5 (1.511 m)     Body mass index is 26.61 kg/m.  Wt Readings from Last 3 Encounters:  06/29/24 134 lb (60.8 kg)  01/26/24 133 lb (60.3 kg)  12/29/23 135 lb (61.2 kg)    General: Well developed, well nourished female in no apparent distress.  HEENT: AT/[place], no external lesions. Hearing intact to the spoken word Eyes: Conjunctiva clear and no icterus. Neck: Trachea midline, neck supple Abdomen: Soft, non tender, non distended Neurologic: Alert, oriented, normal speech, deep tendon biceps reflexes normal Extremities: No pedal pitting edema, no tremors of outstretched hands Skin: Warm, color good.  Psychiatric: Does not appear depressed or anxious  PERTINENT HISTORIC LABORATORY AND IMAGING STUDIES:  All pertinent laboratory results were reviewed. Please see HPI also for further details.   TSH  Date Value Ref Range Status  06/24/2024 2.89 0.40 - 4.50 mIU/L Final  12/25/2023 3.67 0.40 - 4.50 mIU/L Final  06/23/2023 4.14 0.40 - 4.50 mIU/L Final     ASSESSMENT / PLAN  1. Acquired autoimmune hypothyroidism    -Patient has hypothyroidism diagnosed in 2020.  Patient had history of GI intolerance with levothyroxine.  She has been on Armour Thyroid . -Discussed that Armour Thyroid  can potentially cause increased bone loss compared to levothyroxine.  However due to GI intolerance with levothyroxine in the past we will continue on Armour Thyroid  at this time.  As long as we keep thyroid  function test in the normal range should be  okay. - Patient thyroid  function is normal.  She is clinically euthyroid.  Plan: -Continue current dose of Armour Thyroid  15 mg daily.  -Endocrinology follow-up in 6 months.  Jasmine Velez was seen today for follow-up.  Diagnoses and all orders for this visit:  Acquired autoimmune hypothyroidism -     T4, free -     T3, free -     TSH -     thyroid  (ARMOUR THYROID ) 15 MG tablet; Take 1 tablet (15 mg total) by mouth daily.    DISPOSITION Follow up in clinic in 6 months suggested.  Labs prior to follow-up visit.  All questions answered and patient verbalized understanding of the plan.  Jasmine Reaves, MD [facility] Endocrinology [facility] [address] [place], [address] [place], [place] [address] Phone # [phone]  At least part of this note was generated using voice recognition software. Inadvertent word errors may have occurred, which were not recognized during the proofreading process.

## 2024-07-21 ENCOUNTER — Other Ambulatory Visit (HOSPITAL_BASED_OUTPATIENT_CLINIC_OR_DEPARTMENT_OTHER): Payer: Self-pay

## 2024-08-04 ENCOUNTER — Ambulatory Visit

## 2024-08-04 VITALS — BP 152/68 | HR 71 | Temp 97.8°F | Resp 18 | Ht 59.5 in | Wt 133.0 lb

## 2024-08-04 DIAGNOSIS — M810 Age-related osteoporosis without current pathological fracture: Secondary | ICD-10-CM

## 2024-08-04 MED ORDER — DENOSUMAB 60 MG/ML ~~LOC~~ SOSY
60.0000 mg | PREFILLED_SYRINGE | Freq: Once | SUBCUTANEOUS | Status: AC
  Administered 2024-08-04: 60 mg via SUBCUTANEOUS
  Filled 2024-08-04: qty 1

## 2024-08-04 NOTE — Progress Notes (Signed)
 Diagnosis: Osteoporosis  Provider:  Mannam, Praveen MD  Procedure: Injection  Prolia  (Denosumab ), Dose: 60 mg, Site: subcutaneous, Number of injections: 1  Injection Site(s): Left arm  Post Care: Patient declined observation  Discharge: Condition: Good, Destination: Home . AVS Declined  Performed by:  Rocky FORBES Search, RN

## 2024-08-05 ENCOUNTER — Telehealth: Payer: Self-pay

## 2024-08-05 NOTE — Telephone Encounter (Signed)
 Auth Submission: NO AUTH NEEDED Site of care: Site of care: [facility] Payer: Medicare A/B with [employer] supplement Medication & CPT/J Code(s) submitted: Prolia  (Denosumab ) [id number] Diagnosis Code:  Route of submission (phone, fax, portal):  Phone # Fax # Auth type: Buy/Bill PB Units/visits requested: 60mg  x 2 doses Reference number:  Approval from: 08/05/24 to 08/13/25   Patient wants to stay at [facility].

## 2024-08-09 ENCOUNTER — Other Ambulatory Visit (HOSPITAL_BASED_OUTPATIENT_CLINIC_OR_DEPARTMENT_OTHER): Payer: Self-pay

## 2024-08-10 ENCOUNTER — Other Ambulatory Visit (HOSPITAL_BASED_OUTPATIENT_CLINIC_OR_DEPARTMENT_OTHER): Payer: Self-pay

## 2024-08-10 ENCOUNTER — Ambulatory Visit: Admitting: Podiatry

## 2024-08-10 ENCOUNTER — Ambulatory Visit (INDEPENDENT_AMBULATORY_CARE_PROVIDER_SITE_OTHER)

## 2024-08-10 ENCOUNTER — Encounter: Payer: Self-pay | Admitting: Podiatry

## 2024-08-10 DIAGNOSIS — M898X7 Other specified disorders of bone, ankle and foot: Secondary | ICD-10-CM

## 2024-08-10 DIAGNOSIS — M7751 Other enthesopathy of right foot: Secondary | ICD-10-CM | POA: Diagnosis not present

## 2024-08-10 DIAGNOSIS — R600 Localized edema: Secondary | ICD-10-CM

## 2024-08-10 MED ORDER — BUDESONIDE 180 MCG/ACT IN AEPB
1.0000 | INHALATION_SPRAY | Freq: Two times a day (BID) | RESPIRATORY_TRACT | 3 refills | Status: AC
  Filled 2024-08-10: qty 1, 60d supply, fill #0

## 2024-08-10 MED ORDER — ALBUTEROL SULFATE HFA 108 (90 BASE) MCG/ACT IN AERS
2.0000 | INHALATION_SPRAY | Freq: Four times a day (QID) | RESPIRATORY_TRACT | 2 refills | Status: DC | PRN
  Filled 2024-08-10: qty 6.7, 25d supply, fill #0

## 2024-08-10 MED ORDER — TRIAMCINOLONE ACETONIDE 10 MG/ML IJ SUSP
10.0000 mg | Freq: Once | INTRAMUSCULAR | Status: AC
  Administered 2024-08-10: 10 mg via INTRA_ARTICULAR

## 2024-08-10 MED ORDER — ROSUVASTATIN CALCIUM 10 MG PO TABS
10.0000 mg | ORAL_TABLET | Freq: Every day | ORAL | 3 refills | Status: AC
  Filled 2024-08-10: qty 90, 90d supply, fill #0

## 2024-08-10 MED ORDER — HYDROCHLOROTHIAZIDE 12.5 MG PO TABS
12.5000 mg | ORAL_TABLET | ORAL | 3 refills | Status: AC
  Filled 2024-08-10 – 2024-08-13 (×2): qty 45, 90d supply, fill #0

## 2024-08-10 NOTE — Progress Notes (Signed)
 Subjective:   Patient ID: Jasmine Velez, female   DOB: 84 y.o.   MRN: [id number]   HPI Patient presents stating she has a lot of swelling in her right foot her second toe really hurts and her big toe joint has been sore.  States that the swelling has been present recently and she thinks it is because of wearing a pad between the big toe second toe.  Patient does not smoke with caregiver today and likes to be active   Review of Systems  All other systems reviewed and are negative.       Objective:  Physical Exam Vitals and nursing note reviewed.  Constitutional:      Appearance: She is well-developed.  Pulmonary:     Effort: Pulmonary effort is normal.  Musculoskeletal:        General: Normal range of motion.  Skin:    General: Skin is warm.  Neurological:     Mental Status: She is alert.     Neurovascular status is found to be intact muscle strength is found to be adequate range of motion adequate with patient noted to have exquisite discomfort around the first MPJ right with range of motion loss keratotic lesion second digit right medial side and +2 pitting edema in the right foot into the ankle with negative Toula' sign noted.  Patient has good digital perfusion well-oriented x 3     Assessment:  Pitting edema of the right foot into the ankle with negative Homans' sign with inflammatory capsulitis first MPJ and excess ptotic lesion second digit with keratotic tissue formation     Plan:  H&P discussed all conditions I did do a careful injection of the first MPJ today 3 mg dexamethasone  Kenalog  5 mg Xylocaine applied sterile dressing I debrided the lesion second digit courtesy and I applied Unna boot to reduce the swelling in the forefoot taken it above the ankle and then apply Ace wrap and surgical shoe.  Reappoint to recheck may require other treatments patient getting married in 2 weeks  X-ray did indicate mild deformity but no significant acute pathology at this time

## 2024-08-13 ENCOUNTER — Other Ambulatory Visit (HOSPITAL_BASED_OUTPATIENT_CLINIC_OR_DEPARTMENT_OTHER): Payer: Self-pay

## 2024-12-29 ENCOUNTER — Other Ambulatory Visit

## 2025-01-03 ENCOUNTER — Ambulatory Visit: Admitting: Endocrinology

## 2025-01-16 ENCOUNTER — Encounter (INDEPENDENT_AMBULATORY_CARE_PROVIDER_SITE_OTHER): Admitting: Ophthalmology

## 2025-01-23 ENCOUNTER — Encounter (INDEPENDENT_AMBULATORY_CARE_PROVIDER_SITE_OTHER): Admitting: Ophthalmology

## 2025-02-02 ENCOUNTER — Ambulatory Visit
